# Patient Record
Sex: Female | Born: 1975 | Hispanic: No | Marital: Married | Smoking: Never smoker
Health system: Southern US, Community
[De-identification: ages and names within clinical notes are randomized; demographics above are authoritative.]

## PROBLEM LIST (undated history)

## (undated) DIAGNOSIS — O09529 Supervision of elderly multigravida, unspecified trimester: Secondary | ICD-10-CM

## (undated) DIAGNOSIS — D573 Sickle-cell trait: Secondary | ICD-10-CM

## (undated) DIAGNOSIS — O039 Complete or unspecified spontaneous abortion without complication: Secondary | ICD-10-CM

## (undated) DIAGNOSIS — M5430 Sciatica, unspecified side: Secondary | ICD-10-CM

## (undated) DIAGNOSIS — N390 Urinary tract infection, site not specified: Secondary | ICD-10-CM

## (undated) DIAGNOSIS — N898 Other specified noninflammatory disorders of vagina: Secondary | ICD-10-CM

## (undated) DIAGNOSIS — O210 Mild hyperemesis gravidarum: Secondary | ICD-10-CM

## (undated) DIAGNOSIS — IMO0002 Reserved for concepts with insufficient information to code with codable children: Secondary | ICD-10-CM

## (undated) DIAGNOSIS — O4210 Premature rupture of membranes, onset of labor more than 24 hours following rupture, unspecified weeks of gestation: Secondary | ICD-10-CM

## (undated) HISTORY — DX: Complete or unspecified spontaneous abortion without complication: O03.9

## (undated) HISTORY — DX: Premature rupture of membranes, onset of labor more than 24 hours following rupture, unspecified weeks of gestation: O42.10

## (undated) HISTORY — PX: KIDNEY DONATION: SHX685

## (undated) HISTORY — DX: Mild hyperemesis gravidarum: O21.0

## (undated) HISTORY — DX: Urinary tract infection, site not specified: N39.0

## (undated) HISTORY — DX: Other specified noninflammatory disorders of vagina: N89.8

## (undated) HISTORY — DX: Reserved for concepts with insufficient information to code with codable children: IMO0002

## (undated) HISTORY — PX: NO PAST SURGERIES: SHX2092

---

## 1999-03-22 ENCOUNTER — Other Ambulatory Visit: Admission: RE | Admit: 1999-03-22 | Discharge: 1999-03-22 | Payer: Self-pay | Admitting: Obstetrics

## 1999-06-09 ENCOUNTER — Inpatient Hospital Stay (HOSPITAL_COMMUNITY): Admission: AD | Admit: 1999-06-09 | Discharge: 1999-06-12 | Payer: Self-pay | Admitting: Obstetrics & Gynecology

## 2000-05-14 ENCOUNTER — Ambulatory Visit (HOSPITAL_COMMUNITY): Admission: RE | Admit: 2000-05-14 | Discharge: 2000-05-14 | Payer: Self-pay

## 2000-09-24 ENCOUNTER — Inpatient Hospital Stay (HOSPITAL_COMMUNITY): Admission: AD | Admit: 2000-09-24 | Discharge: 2000-09-26 | Payer: Self-pay | Admitting: *Deleted

## 2005-01-01 ENCOUNTER — Inpatient Hospital Stay (HOSPITAL_COMMUNITY): Admission: AD | Admit: 2005-01-01 | Discharge: 2005-01-01 | Payer: Self-pay | Admitting: *Deleted

## 2005-01-17 ENCOUNTER — Inpatient Hospital Stay (HOSPITAL_COMMUNITY): Admission: AD | Admit: 2005-01-17 | Discharge: 2005-01-17 | Payer: Self-pay | Admitting: Obstetrics & Gynecology

## 2005-01-23 ENCOUNTER — Inpatient Hospital Stay (HOSPITAL_COMMUNITY): Admission: AD | Admit: 2005-01-23 | Discharge: 2005-01-23 | Payer: Self-pay | Admitting: Obstetrics and Gynecology

## 2005-02-05 ENCOUNTER — Inpatient Hospital Stay (HOSPITAL_COMMUNITY): Admission: AD | Admit: 2005-02-05 | Discharge: 2005-02-05 | Payer: Self-pay | Admitting: *Deleted

## 2005-03-27 ENCOUNTER — Ambulatory Visit (HOSPITAL_COMMUNITY): Admission: RE | Admit: 2005-03-27 | Discharge: 2005-03-27 | Payer: Self-pay | Admitting: *Deleted

## 2005-04-04 ENCOUNTER — Ambulatory Visit: Payer: Self-pay | Admitting: *Deleted

## 2005-05-30 ENCOUNTER — Ambulatory Visit (HOSPITAL_COMMUNITY): Admission: RE | Admit: 2005-05-30 | Discharge: 2005-05-30 | Payer: Self-pay | Admitting: *Deleted

## 2005-08-26 ENCOUNTER — Ambulatory Visit: Payer: Self-pay | Admitting: Certified Nurse Midwife

## 2005-08-26 ENCOUNTER — Inpatient Hospital Stay (HOSPITAL_COMMUNITY): Admission: AD | Admit: 2005-08-26 | Discharge: 2005-08-29 | Payer: Self-pay | Admitting: Obstetrics and Gynecology

## 2009-10-07 ENCOUNTER — Ambulatory Visit: Payer: Self-pay | Admitting: Family Medicine

## 2009-10-07 ENCOUNTER — Encounter (INDEPENDENT_AMBULATORY_CARE_PROVIDER_SITE_OTHER): Payer: Self-pay | Admitting: Family Medicine

## 2009-10-07 LAB — CONVERTED CEMR LAB
ALT: 9 units/L (ref 0–35)
AST: 9 units/L (ref 0–37)
Albumin: 4.2 g/dL (ref 3.5–5.2)
Alkaline Phosphatase: 46 units/L (ref 39–117)
BUN: 9 mg/dL (ref 6–23)
Basophils Absolute: 0 10*3/uL (ref 0.0–0.1)
Basophils Relative: 0 % (ref 0–1)
CO2: 24 meq/L (ref 19–32)
Calcium: 9.6 mg/dL (ref 8.4–10.5)
Chloride: 105 meq/L (ref 96–112)
Creatinine, Ser: 0.76 mg/dL (ref 0.40–1.20)
Eosinophils Absolute: 0.2 10*3/uL (ref 0.0–0.7)
Eosinophils Relative: 2 % (ref 0–5)
Glucose, Bld: 131 mg/dL — ABNORMAL HIGH (ref 70–99)
HCT: 33.5 % — ABNORMAL LOW (ref 36.0–46.0)
Helicobacter Pylori Antibody-IgG: 0.8
Hemoglobin: 11 g/dL — ABNORMAL LOW (ref 12.0–15.0)
Lymphocytes Relative: 41 % (ref 12–46)
Lymphs Abs: 3.4 10*3/uL (ref 0.7–4.0)
MCHC: 32.8 g/dL (ref 30.0–36.0)
MCV: 79 fL (ref 78.0–100.0)
Monocytes Absolute: 0.4 10*3/uL (ref 0.1–1.0)
Monocytes Relative: 5 % (ref 3–12)
Neutro Abs: 4.3 10*3/uL (ref 1.7–7.7)
Neutrophils Relative %: 52 % (ref 43–77)
Platelets: 282 10*3/uL (ref 150–400)
Potassium: 3.6 meq/L (ref 3.5–5.3)
RBC: 4.24 M/uL (ref 3.87–5.11)
RDW: 13.4 % (ref 11.5–15.5)
Sodium: 138 meq/L (ref 135–145)
TSH: 1.191 microintl units/mL (ref 0.350–4.500)
Total Bilirubin: 0.2 mg/dL — ABNORMAL LOW (ref 0.3–1.2)
Total Protein: 7 g/dL (ref 6.0–8.3)
Vit D, 25-Hydroxy: 27 ng/mL — ABNORMAL LOW (ref 30–89)
WBC: 8.2 10*3/uL (ref 4.0–10.5)

## 2009-11-18 ENCOUNTER — Ambulatory Visit: Payer: Self-pay | Admitting: Internal Medicine

## 2009-11-19 ENCOUNTER — Encounter (INDEPENDENT_AMBULATORY_CARE_PROVIDER_SITE_OTHER): Payer: Self-pay | Admitting: Family Medicine

## 2010-03-29 ENCOUNTER — Encounter (INDEPENDENT_AMBULATORY_CARE_PROVIDER_SITE_OTHER): Payer: Self-pay | Admitting: *Deleted

## 2010-03-29 LAB — CONVERTED CEMR LAB
Basophils Absolute: 0 10*3/uL (ref 0.0–0.1)
Basophils Relative: 0 % (ref 0–1)
Eosinophils Relative: 3 % (ref 0–5)
HCT: 33.7 % — ABNORMAL LOW (ref 36.0–46.0)
Hemoglobin: 11.2 g/dL — ABNORMAL LOW (ref 12.0–15.0)
MCHC: 33.2 g/dL (ref 30.0–36.0)
Monocytes Absolute: 0.6 10*3/uL (ref 0.1–1.0)
RDW: 13.3 % (ref 11.5–15.5)

## 2010-07-09 ENCOUNTER — Encounter: Payer: Self-pay | Admitting: *Deleted

## 2010-07-27 ENCOUNTER — Emergency Department (HOSPITAL_COMMUNITY): Payer: Self-pay

## 2010-07-27 ENCOUNTER — Encounter (HOSPITAL_COMMUNITY): Payer: Self-pay | Admitting: Radiology

## 2010-07-27 ENCOUNTER — Emergency Department (HOSPITAL_COMMUNITY)
Admission: EM | Admit: 2010-07-27 | Discharge: 2010-07-27 | Disposition: A | Discharge status: Home / Self Care | Payer: Self-pay | Attending: Emergency Medicine | Admitting: Emergency Medicine

## 2010-07-27 DIAGNOSIS — M25519 Pain in unspecified shoulder: Secondary | ICD-10-CM | POA: Insufficient documentation

## 2010-07-27 DIAGNOSIS — R0789 Other chest pain: Secondary | ICD-10-CM | POA: Insufficient documentation

## 2010-07-27 DIAGNOSIS — M79609 Pain in unspecified limb: Secondary | ICD-10-CM | POA: Insufficient documentation

## 2010-07-27 DIAGNOSIS — R110 Nausea: Secondary | ICD-10-CM | POA: Insufficient documentation

## 2010-07-27 DIAGNOSIS — M546 Pain in thoracic spine: Secondary | ICD-10-CM | POA: Insufficient documentation

## 2010-07-27 LAB — BASIC METABOLIC PANEL
BUN: 6 mg/dL (ref 6–23)
GFR calc non Af Amer: >60 mL/min (ref >60)
Glucose, Bld: 141 mg/dL — ABNORMAL HIGH (ref 70–99)
Potassium: 3.8 mEq/L (ref 3.5–5.1)

## 2010-07-27 LAB — URINALYSIS, ROUTINE W REFLEX MICROSCOPIC
Bilirubin Urine: NEGATIVE
Specific Gravity, Urine: 1.011 (ref 1.005–1.030)
pH: 7.5 (ref 5.0–8.0)

## 2010-07-27 LAB — URINE MICROSCOPIC-ADD ON

## 2010-07-27 LAB — CBC
HCT: 35.7 % — ABNORMAL LOW (ref 36.0–46.0)
MCH: 26.7 pg (ref 26.0–34.0)
MCHC: 33.9 g/dL (ref 30.0–36.0)
RDW: 12.9 % (ref 11.5–15.5)

## 2010-07-27 LAB — POCT CARDIAC MARKERS: CKMB, poc: <1 ng/mL — ABNORMAL LOW (ref 1.0–8.0)

## 2010-07-27 LAB — DIFFERENTIAL
Basophils Absolute: 0 10*3/uL (ref 0.0–0.1)
Eosinophils Relative: 0 % (ref 0–5)
Lymphocytes Relative: 25 % (ref 12–46)
Monocytes Absolute: 0.2 10*3/uL (ref 0.1–1.0)
Monocytes Relative: 3 % (ref 3–12)

## 2010-07-27 MED ORDER — IOHEXOL 300 MG/ML  SOLN: 100.0000 mL | Freq: Once | INTRAMUSCULAR | Status: DC | PRN

## 2010-10-10 ENCOUNTER — Other Ambulatory Visit (HOSPITAL_COMMUNITY): Payer: Self-pay | Admitting: Family Medicine

## 2010-10-10 ENCOUNTER — Ambulatory Visit (HOSPITAL_COMMUNITY)
Admission: RE | Admit: 2010-10-10 | Discharge: 2010-10-10 | Disposition: A | Discharge status: Home / Self Care | Payer: Self-pay | Source: Ambulatory Visit | Attending: Family Medicine | Admitting: Family Medicine

## 2010-10-10 DIAGNOSIS — M542 Cervicalgia: Secondary | ICD-10-CM

## 2010-10-10 DIAGNOSIS — M25519 Pain in unspecified shoulder: Secondary | ICD-10-CM | POA: Insufficient documentation

## 2011-04-04 ENCOUNTER — Emergency Department (HOSPITAL_COMMUNITY): Payer: BC Managed Care – PPO

## 2011-04-04 ENCOUNTER — Emergency Department (HOSPITAL_COMMUNITY)
Admission: EM | Admit: 2011-04-04 | Discharge: 2011-04-04 | Disposition: A | Discharge status: Home / Self Care | Payer: BC Managed Care – PPO | Attending: Emergency Medicine | Admitting: Emergency Medicine

## 2011-04-04 DIAGNOSIS — M25519 Pain in unspecified shoulder: Secondary | ICD-10-CM | POA: Insufficient documentation

## 2011-04-04 DIAGNOSIS — M62838 Other muscle spasm: Secondary | ICD-10-CM | POA: Insufficient documentation

## 2011-10-05 ENCOUNTER — Ambulatory Visit (INDEPENDENT_AMBULATORY_CARE_PROVIDER_SITE_OTHER): Payer: BC Managed Care – PPO | Admitting: Physician Assistant

## 2011-10-05 VITALS — BP 113/70 | HR 75 | Temp 98.7°F | Resp 16 | Ht 69.5 in | Wt 205.0 lb

## 2011-10-05 DIAGNOSIS — R5383 Other fatigue: Secondary | ICD-10-CM

## 2011-10-05 DIAGNOSIS — N912 Amenorrhea, unspecified: Secondary | ICD-10-CM

## 2011-10-05 DIAGNOSIS — R110 Nausea: Secondary | ICD-10-CM

## 2011-10-05 LAB — POCT URINE PREGNANCY: Preg Test, Ur: NEGATIVE

## 2011-10-05 LAB — POCT CBC
Hemoglobin: 12.1 g/dL — AB (ref 12.2–16.2)
MCH, POC: 26.5 pg — AB (ref 27–31.2)
MCV: 80.9 fL (ref 80–97)
RBC: 4.57 M/uL (ref 4.04–5.48)
WBC: 8.8 10*3/uL (ref 4.6–10.2)

## 2011-10-05 MED ORDER — PROMETHAZINE HCL 25 MG PO TABS
25.0000 mg | ORAL_TABLET | Freq: Three times a day (TID) | ORAL | Status: DC | PRN
Start: 2011-10-05 — End: 2012-07-16

## 2011-10-05 NOTE — Progress Notes (Signed)
  Subjective:    Patient ID: Kirsten Hoover, female    DOB: [birth year], 36 y.o.   MRN: [id number]  HPI 36 yo female here with complaints of fatigue and nausea.  S/P IUD removal 09/13/11.  Not using other BC.  She is trying to get pregnant and is on PNV.  Symptoms for ~1week.  No abdominal pain, no breast tenderness. No diarrhea or constipation.  When she did have the IUD she continued having periods regularly.  Her last menses started 09/01/11. She took a home pregnancy test today which was negative. She would like to do a test here. Review of Systems  All other systems reviewed and are negative.      Objective:   Physical Exam  Nursing note and vitals reviewed. Constitutional: She is oriented to person, place, and time. She appears well-developed and well-nourished.  HENT:  Head: Normocephalic and atraumatic.  Neck: Normal range of motion. Neck supple. No thyromegaly present.  Cardiovascular: Normal rate, regular rhythm, normal heart sounds and intact distal pulses.   Pulmonary/Chest: Effort normal and breath sounds normal.  Abdominal: Soft. Bowel sounds are normal.  Neurological: She is alert and oriented to person, place, and time.  Skin: Skin is warm and dry.   Results for orders placed in visit on 10/05/11  POCT URINE PREGNANCY      Component Value Range   Preg Test, Ur Negative    POCT CBC      Component Value Range   WBC 8.8  4.6 - 10.2 (K/uL)   Lymph, poc 4.3 (*) 0.6 - 3.4    POC LYMPH PERCENT 48.6  10 - 50 (%L)   MID (cbc) 0.4  0 - 0.9    POC MID % 5.1  0 - 12 (%M)   POC Granulocyte 4.1  2 - 6.9    Granulocyte percent 46.3  37 - 80 (%G)   RBC 4.57  4.04 - 5.48 (M/uL)   Hemoglobin 12.1 (*) 12.2 - 16.2 (g/dL)   HCT, POC 40.9 (*) 81.1 - 47.9 (%)   MCV 80.9  80 - 97 (fL)   MCH, POC 26.5 (*) 27 - 31.2 (pg)   MCHC 32.7  31.8 - 35.4 (g/dL)   RDW, POC 91.4     Platelet Count, POC 342  142 - 424 (K/uL)   MPV 10.3  0 - 99.8 (fL)       Assessment & Plan:  Fatigue-?viral  syndrome, check TSH Amenorrhea-neg pregnancy test. Nausea w/o vomiting-Phenergan sent to pharmacy Advised to take another pregnancy test in 1 week if still no menses.

## 2011-10-25 ENCOUNTER — Ambulatory Visit (INDEPENDENT_AMBULATORY_CARE_PROVIDER_SITE_OTHER): Payer: BC Managed Care – PPO | Admitting: Family Medicine

## 2011-10-25 VITALS — BP 131/85 | HR 64 | Temp 98.4°F | Resp 16 | Ht 70.0 in | Wt 203.0 lb

## 2011-10-25 DIAGNOSIS — R1013 Epigastric pain: Secondary | ICD-10-CM

## 2011-10-25 DIAGNOSIS — R1011 Right upper quadrant pain: Secondary | ICD-10-CM

## 2011-10-25 DIAGNOSIS — R110 Nausea: Secondary | ICD-10-CM

## 2011-10-25 LAB — POCT CBC
HCT, POC: 37.8 % (ref 37.7–47.9)
Lymph, poc: 3.9 — AB (ref 0.6–3.4)
MCHC: 33.6 g/dL (ref 31.8–35.4)
MPV: 9.8 fL (ref 0–99.8)
POC Granulocyte: 3.6 (ref 2–6.9)
POC LYMPH PERCENT: 48.7 %L (ref 10–50)
POC MID %: 5.7 %M (ref 0–12)
RDW, POC: 13.2 %

## 2011-10-25 MED ORDER — OMEPRAZOLE 20 MG PO CPDR: 20.0000 mg | DELAYED_RELEASE_CAPSULE | Freq: Every day | ORAL | Status: DC

## 2011-10-25 MED ORDER — ONDANSETRON 4 MG PO TBDP: 4.0000 mg | ORAL_TABLET | Freq: Three times a day (TID) | ORAL | Status: AC | PRN

## 2011-10-25 NOTE — Progress Notes (Signed)
Subjective:    Patient ID: Kirsten Hoover, female    DOB: 07-Aug-1975, 36 y.o.   MRN: [id number]  HPI Kirsten Hoover is a 36 y.o. female Seen 10/05/11 with fatigue and nausea. "S/P IUD removal 09/13/11.  Not using other BC.  She is trying to get pregnant and is on PNV.  Symptoms for ~1week= at that time.  No abdominal pain, no breast tenderness. No diarrhea or constipation at that time..  When she did have the IUD she continued having periods regularly.  Her last menses started 09/01/11. She took a home pregnancy test 10/05/11 which was negative.  Negative HCG , reassuring CBC and TSH last office visit.  Rx Phenergan.  Today states she has back and forth symptoms of gas and abd pain. No blood in stools.  Nausea with milk or eating at times. Took Phenergan - helped with sx's - last dose 3 das ago.  LMP 10/05/11 - 8 days.  Normal menses.  Upper abdomen episodic pain at times.  Increased belching/gas.  Tylenol occasionally - stress at work.  Designer, [employer].   Nonsmoker, no alcohol.  2 caffeinated drinks per day. Notes some increased gas/stomach upset with caffeine.  Review of Systems  Constitutional: Negative for fever and chills.  Gastrointestinal: Positive for nausea, abdominal pain and diarrhea. Negative for vomiting, constipation and blood in stool.       Occasional diarrhea with milk or cheese.   Genitourinary: Negative for dysuria, urgency, hematuria, difficulty urinating, menstrual problem and pelvic pain.  Neurological: Positive for headaches.       Occasional ha with stress at work.       Objective:   Physical Exam  Constitutional: She is oriented to person, place, and time. She appears well-developed and well-nourished.  Cardiovascular: Normal rate, normal heart sounds and intact distal pulses.   Pulmonary/Chest: Effort normal and breath sounds normal.  Abdominal: Soft. Normal appearance and bowel sounds are normal. She exhibits no shifting dullness and no distension. There is no  hepatosplenomegaly. There is tenderness in the right upper quadrant and epigastric area. There is no CVA tenderness, no tenderness at McBurney's point and negative Murphy's sign. No hernia.       ttp epigastric and RUQ, but negative murphys  Neurological: She is alert and oriented to person, place, and time.  Skin: Skin is warm and dry. No rash noted.  Psychiatric: She has a normal mood and affect. Her behavior is normal.   Results for orders placed in visit on 10/25/11  POCT CBC      Component Value Range   WBC 8.0  4.6 - 10.2 (K/uL)   Lymph, poc 3.9 (*) 0.6 - 3.4    POC LYMPH PERCENT 48.7  10 - 50 (%L)   MID (cbc) 0.5  0 - 0.9    POC MID % 5.7  0 - 12 (%M)   POC Granulocyte 3.6  2 - 6.9    Granulocyte percent 45.6  37 - 80 (%G)   RBC 4.72  4.04 - 5.48 (M/uL)   Hemoglobin 12.7  12.2 - 16.2 (g/dL)   HCT, POC 40.9  81.1 - 47.9 (%)   MCV 80.1  80 - 97 (fL)   MCH, POC 26.9 (*) 27 - 31.2 (pg)   MCHC 33.6  31.8 - 35.4 (g/dL)   RDW, POC 91.4     Platelet Count, POC 338  142 - 424 (K/uL)   MPV 9.8  0 - 99.8 (fL)  Assessment & Plan:  Kirsten Hoover is a 36 y.o. female  abd pain, nausea, increased gas.  Possible multifactorial - lactose intolerance, GERD+/- gastritis, and possible cholelithiasis with RUQ symptoms.  Check CMP, CBC, H pylori and RUQ [place]. Avoid dairy,  And decrease caffeine, fried/fatty/spicy food intake. Start omeprazole 20mg  QD, zofran Q 8h prn, and recheck in 2 weeks, sooner if worse.

## 2011-10-25 NOTE — Patient Instructions (Signed)
Check CMP, CBC, H pylori and RUQ [place]. Avoid dairy, decrease caffeine, fried/fatty/spicy food intake. Start omeprazole 20mg  each day, and zofran up to every 8 hours as needed for nausea. recheck in 2 weeks, sooner if worse.   Lactose Intolerance, Adult Lactose intolerance is when the body is not able to digest lactose, a sugar found in milk and milk products. Lactose intolerance is caused by your body not producing enough of the enzyme lactase. When there is not enough lactase to digest the amount of lactose consumed, discomfort may be felt. Lactose intolerance is not a milk allergy. For most people, lactase deficiency is a condition that develops naturally over time. After about the age of 2, the body begins to produce less lactase. But many people may not experience symptoms until they are much older. CAUSES Things that can cause you to be lactose intolerant include:  Aging.   Being born without the ability to make lactase.   Certain digestive diseases.   Injuries to the small intestine.  SYMPTOMS   Feeling sick to your stomach (nauseous).   Diarrhea.   Cramps.   Bloating.   Gas.  Symptoms usually show up a half hour or 2 hours after eating or drinking products containing lactose. TREATMENT  No treatment can improve the body's ability to produce lactase. However, symptoms can be controlled through diet. A medicine may be given to you to take when you consume lactose-containing foods or drinks. The medicine contains the lactase enzyme, which help the body digest lactose better. HOME CARE INSTRUCTIONS  Eat or drink dairy products as told by your caregiver or dietician.   Take all medicine as directed by your caregiver.   Find lactose-free or lactose-reduced products at your local grocery store.   Talk to your caregiver or dietician to decide if you need any dietary supplements.  The following is the amount of calcium needed from the diet:  19 to 50 years: 1000 mg   Over 50  years: 1200 mg  Calcium and Lactose in Common Foods Non-Dairy Products / Calcium Content (mg)  Calcium-fortified orange juice, 1 cup / 308 to 344 mg   Sardines, with edible bones, 3 oz / 270 mg   Salmon, canned, with edible bones, 3 oz / 205 mg   Soymilk, fortified, 1 cup / 200 mg   Broccoli (raw), 1 cup / 90 mg   Orange, 1 medium / 50 mg   Pinto beans,  cup / 40 mg   Tuna, canned, 3 oz / 10 mg   Lettuce greens,  cup / 10 mg  Dairy Products / Calcium Content (mg) / Lactose Content (g)  Yogurt, plain, low-fat, 1 cup / 415 mg / 5 g   Milk, reduced fat, 1 cup / 295 mg / 11 g   Swiss cheese, 1 oz / 270 mg / 1 g   Ice cream,  cup / 85 mg / 6 g   Cottage cheese,  cup / 75 mg / 2 to 3 g  SEEK MEDICAL CARE IF: You have no relief from your symptoms. Document Released: 06/05/2005 Document Revised: 05/25/2011 Document Reviewed: 09/02/2010 [facility] Patient Information 2012 [place], [place].

## 2011-10-26 LAB — COMPREHENSIVE METABOLIC PANEL
Alkaline Phosphatase: 52 U/L (ref 39–117)
Creat: 0.6 mg/dL (ref 0.50–1.10)
Glucose, Bld: 91 mg/dL (ref 70–99)
Sodium: 136 mEq/L (ref 135–145)
Total Bilirubin: 0.3 mg/dL (ref 0.3–1.2)
Total Protein: 7.6 g/dL (ref 6.0–8.3)

## 2011-11-03 ENCOUNTER — Ambulatory Visit
Admission: RE | Admit: 2011-11-03 | Discharge: 2011-11-03 | Disposition: A | Discharge status: Home / Self Care | Payer: BC Managed Care – PPO | Source: Ambulatory Visit | Attending: Family Medicine | Admitting: Family Medicine

## 2011-11-03 ENCOUNTER — Other Ambulatory Visit: Payer: Self-pay

## 2011-11-03 DIAGNOSIS — R1013 Epigastric pain: Secondary | ICD-10-CM

## 2011-11-03 DIAGNOSIS — R1011 Right upper quadrant pain: Secondary | ICD-10-CM

## 2011-11-03 DIAGNOSIS — R110 Nausea: Secondary | ICD-10-CM

## 2012-02-20 ENCOUNTER — Ambulatory Visit (INDEPENDENT_AMBULATORY_CARE_PROVIDER_SITE_OTHER): Payer: BC Managed Care – PPO | Admitting: Family Medicine

## 2012-02-20 VITALS — BP 112/68 | HR 83 | Temp 98.4°F | Resp 16 | Ht 70.0 in | Wt 203.0 lb

## 2012-02-20 DIAGNOSIS — R109 Unspecified abdominal pain: Secondary | ICD-10-CM

## 2012-02-20 DIAGNOSIS — R1084 Generalized abdominal pain: Secondary | ICD-10-CM

## 2012-02-20 DIAGNOSIS — Z3200 Encounter for pregnancy test, result unknown: Secondary | ICD-10-CM

## 2012-02-20 DIAGNOSIS — Z3201 Encounter for pregnancy test, result positive: Secondary | ICD-10-CM

## 2012-02-20 LAB — POCT URINE PREGNANCY: Preg Test, Ur: POSITIVE

## 2012-02-20 NOTE — Patient Instructions (Addendum)
You are indeed pregnant. Make an appointment to be seen by your OB/GYN.  If you have any vaginal bleeding, any abdominal pain, any difficulty passing urine come back to see [place] immediately or go to [facility].   We will call you with the ultrasound appointment.

## 2012-02-20 NOTE — Progress Notes (Signed)
Patient ID: Kirsten Hoover, female   DOB: 1975/07/11, 36 y.o.   MRN: [id number] Kirsten Hoover is a 36 y.o. female who presents to Urgent Care today for confirmation of positive pregnancy test which she took at home:  1.  Positive pregnancy test:  Patient had positive test 2 days ago.  Her LMP was July 25.  She has not had any nausea, vomiting, change in urination, change in bowel habits.  This was an unplanned pregnancy.  She is a [id number].  She is in monogamous relationship with husband.  She does report some brownish vaginal discharge that appeared 3 days ago and recurred today.  She denies any abdominal pain, fevers, or chills.     PMH reviewed.  ROS as above otherwise neg.  No chest pain, palpitations, SOB, N/V/D.  Medications reviewed. Current Outpatient Prescriptions  Medication Sig Dispense Refill  . promethazine (PHENERGAN) 25 MG tablet Take 1 tablet (25 mg total) by mouth every 8 (eight) hours as needed for nausea.  20 tablet  0    Exam:  BP 112/68  Pulse 83  Temp 98.4 F (36.9 C)  Resp 16  Ht 5\' 10"  (1.778 m)  Wt 203 lb (92.08 kg)  BMI 29.13 kg/m2  LMP 01/13/2012 Gen: Well NAD HEENT: EOMI,  MMM Lungs: CTABL Nl WOB Heart: RRR no MRG Abd: Normal bowel sounds, bilateral lower quadrant tenderness on palpation, mild in nature.  No guarding or rebound.  No Psoas sign negative.   Exts: Non edematous BL  LE, warm and well perfused.   Assessment and Plan:  1.  Positive pregnancy test:  Confirmed here in clinic.  Patient plans to set up an appointment with her OB-GYN.  This was unplanned pregnancy, but not unwanted.   2.  Vaginal discharge and abdominal tenderness:  After finding the tenderness, asked again if she had any abdominal pain before I pressed on her and she said no.  Patient denies that her discharge looks like old or fresh blood.  Want to ensure no ectopic pregnancy, will send for STAT ultrasound of pelvis.  If intra-uterine pregnancy confirmed, possible physiologic  discharge.  I provided the patient with explicit warnings and red flags that would prompt return to clinic or the ED, including any sudden abdominal pain, vaginal bleeding, etc.  Will call patient with results.

## 2012-02-21 ENCOUNTER — Other Ambulatory Visit: Payer: Self-pay | Admitting: Family Medicine

## 2012-02-21 ENCOUNTER — Ambulatory Visit (HOSPITAL_COMMUNITY)
Admission: RE | Admit: 2012-02-21 | Discharge: 2012-02-21 | Disposition: A | Discharge status: Home / Self Care | Payer: BC Managed Care – PPO | Source: Ambulatory Visit | Attending: Family Medicine | Admitting: Family Medicine

## 2012-02-21 DIAGNOSIS — Z3689 Encounter for other specified antenatal screening: Secondary | ICD-10-CM | POA: Insufficient documentation

## 2012-02-21 DIAGNOSIS — R109 Unspecified abdominal pain: Secondary | ICD-10-CM

## 2012-02-21 DIAGNOSIS — Z3200 Encounter for pregnancy test, result unknown: Secondary | ICD-10-CM

## 2012-02-21 DIAGNOSIS — Z3490 Encounter for supervision of normal pregnancy, unspecified, unspecified trimester: Secondary | ICD-10-CM

## 2012-02-21 DIAGNOSIS — O3680X0 Pregnancy with inconclusive fetal viability, not applicable or unspecified: Secondary | ICD-10-CM | POA: Insufficient documentation

## 2012-02-21 DIAGNOSIS — R1084 Generalized abdominal pain: Secondary | ICD-10-CM

## 2012-02-22 ENCOUNTER — Telehealth: Payer: Self-pay | Admitting: Family Medicine

## 2012-02-22 NOTE — Telephone Encounter (Signed)
Patient returned call.  Discussed intrauterine pregnancy.  Patient appreciative of call.  Will defer decision of FU U/S to obstetrician.

## 2012-02-22 NOTE — Telephone Encounter (Signed)
Called and left message for patient.  She had viable intrauterine pregnancy at 4 weeks 4 days.  Recommended FU ultrasound in 2 weeks to assess for cardiac activity.  Defer to [facility] for further management.

## 2012-03-07 ENCOUNTER — Inpatient Hospital Stay (HOSPITAL_COMMUNITY)
Admission: AD | Admit: 2012-03-07 | Discharge: 2012-03-07 | Disposition: A | Discharge status: Home / Self Care | Payer: BC Managed Care – PPO | Source: Ambulatory Visit | Attending: Obstetrics and Gynecology | Admitting: Obstetrics and Gynecology

## 2012-03-07 ENCOUNTER — Inpatient Hospital Stay (HOSPITAL_COMMUNITY): Payer: BC Managed Care – PPO

## 2012-03-07 ENCOUNTER — Encounter (HOSPITAL_COMMUNITY): Payer: Self-pay

## 2012-03-07 DIAGNOSIS — O26859 Spotting complicating pregnancy, unspecified trimester: Secondary | ICD-10-CM | POA: Insufficient documentation

## 2012-03-07 DIAGNOSIS — O468X9 Other antepartum hemorrhage, unspecified trimester: Secondary | ICD-10-CM

## 2012-03-07 DIAGNOSIS — O418X90 Other specified disorders of amniotic fluid and membranes, unspecified trimester, not applicable or unspecified: Secondary | ICD-10-CM

## 2012-03-07 DIAGNOSIS — O4690 Antepartum hemorrhage, unspecified, unspecified trimester: Secondary | ICD-10-CM

## 2012-03-07 LAB — CBC WITH DIFFERENTIAL/PLATELET
Basophils Relative: 0 % (ref 0–1)
HCT: 35 % — ABNORMAL LOW (ref 36.0–46.0)
Hemoglobin: 11.7 g/dL — ABNORMAL LOW (ref 12.0–15.0)
Lymphocytes Relative: 50 % — ABNORMAL HIGH (ref 12–46)
Lymphs Abs: 4.3 10*3/uL — ABNORMAL HIGH (ref 0.7–4.0)
MCHC: 33.4 g/dL (ref 30.0–36.0)
Monocytes Absolute: 0.6 10*3/uL (ref 0.1–1.0)
Monocytes Relative: 8 % (ref 3–12)
Neutro Abs: 3.4 10*3/uL (ref 1.7–7.7)
RBC: 4.33 MIL/uL (ref 3.87–5.11)

## 2012-03-07 LAB — ABO/RH: ABO/RH(D): O POS

## 2012-03-07 NOTE — MAU Provider Note (Signed)
History     CSN: [id number]  Arrival date and time: 03/07/12 1629   None     No chief complaint on file.  HPI Kirsten Hoover is a 36 y.o. female @ [redacted] weeks gestation who presents to MAU for follow up ultrasound. She reports brown vaginal spotting. She had a positive pregnancy test and ultrasound 02/20/12 ordered by her doctor in the [facility]. On 9/3 ultrasound showed a a 4 week 4 day IUGS and it was recommended that she follow up in 2 weeks to repeat the u/s. She has an appointment with CC/OB to start prenatal care but started bleeding today so she came in. The history was provided by the patient and her medical record.  OB History    Grav Para Term Preterm Abortions TAB SAB Ect Mult Living                  No past medical history on file.  No past surgical history on file.  No family history on file.  History  Substance Use Topics  . Smoking status: Never Smoker   . Smokeless tobacco: Not on file  . Alcohol Use: No    Allergies: No Known Allergies  Prescriptions prior to admission  Medication Sig Dispense Refill  . promethazine (PHENERGAN) 25 MG tablet Take 1 tablet (25 mg total) by mouth every 8 (eight) hours as needed for nausea.  20 tablet  0    Review of Systems  Constitutional: Negative for fever and chills.  Eyes: Negative.   Respiratory: Negative for cough and wheezing.   Cardiovascular: Negative.   Gastrointestinal: Positive for nausea and abdominal pain.  Genitourinary: Positive for frequency.       Vaginal bleeding  Musculoskeletal: Positive for back pain.  Skin: Negative for rash.  Neurological: Negative for dizziness, seizures and headaches.  Psychiatric/Behavioral: Negative for depression. The patient is not nervous/anxious.    Physical Exam   Last menstrual period 01/13/2012.  Physical Exam  Nursing note and vitals reviewed. Constitutional: She is oriented to person, place, and time. She appears well-developed and  well-nourished. No distress.  HENT:  Head: Normocephalic and atraumatic.  Eyes: EOM are normal.  Neck: Neck supple.  Cardiovascular: Normal rate.   Respiratory: Effort normal.  Genitourinary:       Patient declined.  Musculoskeletal: Normal range of motion.  Neurological: She is alert and oriented to person, place, and time.  Skin: Skin is warm and dry.  Psychiatric: She has a normal mood and affect. Her behavior is normal. Judgment and thought content normal.   US Ob Transvaginal  03/07/2012  *RADIOLOGY REPORT*  Clinical Data: Pregnant, bleeding  TRANSVAGINAL OBSTETRIC US  Technique:  Transvaginal ultrasound was performed for complete evaluation of the gestation as well as the maternal uterus, adnexal regions, and pelvic cul-de-sac.  Comparison:  02/21/2012.  Intrauterine gestational sac: Present Yolk sac: Present Embryo: Present Cardiac Activity: Absent  CRL: 5 mm           6   w  3   d          [place] EDC: 10/28/2012  Subchorionic hemorrhage: Large  Maternal uterus/adnexae: Left ovary is unremarkable, measuring 2.0 x 3.1 x 2.5 cm, and notable for a probable corpus luteal cyst.  Right ovary is unremarkable, measuring 1.5 x 2.6 x 1.7 cm.  No free fluid.  IMPRESSION: Single intrauterine gestation with estimated gestational age [redacted] weeks 3 days.  Crown-rump measures 5 mm, without  definite cardiac activity.  This is suspicious (but not definitive) for failed pregnancy.  Correlate with serial beta HCG and consider follow-up imaging in 10 days.  Large subchorionic hemorrhage.   Original Report Authenticated By: Charline Bills, M.D.    MAU Course  Procedures Assessment: 36 y.o. female with bleeding in early pregnancy   [facility]   Plan:  Keep scheduled appointment with CC/OB   Return if increased bleeding or pain   NEESE,HOPE, RN, FNP, [facility] 03/07/2012, 4:46 PM

## 2012-03-07 NOTE — MAU Note (Signed)
Pt states was seen here 1 month ago, told everything was ok, this am noted bleeding, has lightened up since then, now light pink, noted with wiping only. Denies pain at present. Did note back pain yesterday, none present today. Denies vaginal d/c changes prior to bleeding

## 2012-03-08 NOTE — MAU Provider Note (Signed)
Attestation of Attending Supervision of Advanced Practitioner (CNM/NP): Evaluation and management procedures were performed by the Advanced Practitioner under my supervision and collaboration.  I have reviewed the Advanced Practitioner's note and chart, and I agree with the management and plan.  Saman Giddens 03/08/2012 6:10 AM

## 2012-03-14 ENCOUNTER — Other Ambulatory Visit: Payer: Self-pay | Admitting: Obstetrics and Gynecology

## 2012-03-14 ENCOUNTER — Ambulatory Visit (INDEPENDENT_AMBULATORY_CARE_PROVIDER_SITE_OTHER): Payer: BC Managed Care – PPO

## 2012-03-14 ENCOUNTER — Encounter: Payer: Self-pay | Admitting: Obstetrics and Gynecology

## 2012-03-14 ENCOUNTER — Telehealth: Payer: Self-pay | Admitting: Obstetrics and Gynecology

## 2012-03-14 ENCOUNTER — Ambulatory Visit (INDEPENDENT_AMBULATORY_CARE_PROVIDER_SITE_OTHER): Payer: BC Managed Care – PPO | Admitting: Obstetrics and Gynecology

## 2012-03-14 VITALS — BP 98/68 | Resp 16 | Wt 204.0 lb

## 2012-03-14 DIAGNOSIS — O26849 Uterine size-date discrepancy, unspecified trimester: Secondary | ICD-10-CM

## 2012-03-14 DIAGNOSIS — O209 Hemorrhage in early pregnancy, unspecified: Secondary | ICD-10-CM

## 2012-03-14 LAB — US OB TRANSVAGINAL

## 2012-03-14 NOTE — Patient Instructions (Signed)
Threatened Miscarriage  A threatened miscarriage is a pregnancy that may end. It may be marked by bleeding during the first 20 weeks of pregnancy. Often, the pregnancy can continue without any more problems. You may be asked to stop:  Having sex (intercourse).   Having orgasms.   Using tampons.   Exercising.   Doing heavy physical activity and work.  HOME CARE   Your doctor may tell you to take bed rest and to stop activities and work.   Write down the number of pads you use each day. Write down how often you change pads. Write down how soaked they are.   Follow your doctor's advice for follow-up visits and tests.   If your blood type is Rh-negative and the father's blood is Rh-positive (or is not known), you may get a shot to protect the baby.   If you have a miscarriage, save all the tissue you pass in a container. Take the container to your doctor.  GET HELP RIGHT AWAY IF:   You have bad cramps or pain in your belly (abdomen), lower belly, or back.   You have a fever or chills.   Your bleeding gets worse or you pass large clots of blood or tissue. Save this tissue to show your doctor.   You feel lightheaded, weak, dizzy, or pass out (faint).   You have a gush of fluid from your vagina.  MAKE SURE YOU:   Understand these instructions.   Will watch your condition.   Will get help right away if you are not doing well or get worse.  Document Released: 05/18/2008 Document Revised: 05/25/2011 Document Reviewed: 06/21/2009 [facility] Patient Information 2012 [place], [place].

## 2012-03-14 NOTE — Telephone Encounter (Signed)
Lab result for Ms. Zephaniah Schetter  Beta HCG : [id number] - resulted 03/14/12  Report taken by D.Connye Burkitt, CNM.

## 2012-03-14 NOTE — Progress Notes (Signed)
When did bleeding start: 2 wks ago How  Long: currently How often changing pad/tampon: none. Mainly happens after wiping  Bleeding Disorders: no Cramping: yes "sometimes" Contraception: no pregnant Fibroids: no Hormone Therapy: no New Medications: no Menopausal Symptoms: no Vag. Discharge: no Abdominal Pain: no Increased Stress: no  *Viability u/s today 2.5cm echogenic structure present could be fetal pole.  GS size today [redacted]w[redacted]d.

## 2012-03-14 NOTE — Telephone Encounter (Signed)
/  Pt called, has appt scheduled for NOB interview on 03/21/12, has spotting.  Pt says had 2 days of brown spotting and today is red.  Was seen @ [facility] for bleeding, US shows pt has large [facility] on 03/07/12, comments stated pt need a f/u US in 10 days d/t no definitive cardiac activity.  Pt sched appt for today w/ MK for eval, pt also sched for [place] as well pt to arrive @ 1400.

## 2012-03-15 ENCOUNTER — Telehealth: Payer: Self-pay

## 2012-03-15 LAB — GC/CHLAMYDIA PROBE AMP, GENITAL
Chlamydia, DNA Probe: NEGATIVE
GC Probe Amp, Genital: NEGATIVE

## 2012-03-15 NOTE — Telephone Encounter (Signed)
LM for pt to cb regarding quant needed on Sat

## 2012-03-15 NOTE — Progress Notes (Signed)
Presents with c/o of "bleeding red to brown x 2 weeks some cramping. Certain of LMP no on contraception at time of conception, taking PNV"  HX of SVD x 3 With AMA  Ojective Calm no distress Lungs clear bilaterally, AP RRR, bowel sounds active abdomen nontender, Normal hair distrubition mons pubis,  EGBUS WNL, sterile speculum exam,  vagina pink, moist normal rugae,  cerix LTC, no cervical motion tenderness, Uterus small No adnexal masses or tenderness Small amount brown to red mucus at cervix GC/CHL sent [place] Vag  6/3 [place] at MAU Large subchorionic hemorrhage, with crown        rump 5 mm [place] today GS 6 3/7 week 2.3 echogenic structure present                 could be fetal pole. WET PREP neg clue, neg trich, neg hyphae A/P early pg bleeding, possible miscarriage discussed s/s bleeding and pain to report reviewed and handout given, QHCG today with f/o at MAU on Saturday 5 pm discussed. Collaboration with Dr. Stefano Gaul per telephone Lavera Guise, CNM Late entry from 12/13/3011

## 2012-03-16 ENCOUNTER — Other Ambulatory Visit: Payer: Self-pay | Admitting: Obstetrics and Gynecology

## 2012-03-16 ENCOUNTER — Telehealth: Payer: Self-pay | Admitting: Obstetrics and Gynecology

## 2012-03-16 NOTE — Telephone Encounter (Signed)
TC to patient--reviewed results of repeat QHCG. 9/26--10,733 9/28--6762  Seen 9/26 for small amount bleeding. Rh + type. Reports had 2 episodes in last 24 hours of moderate bleeding--last one this am.  No bleeding or pain since then.  Findings reviewed. Support offered. Will repeat QHCG on Thursday, 10/3, at [facility]. Office sent message to contact patient to schedule that for this week. Patient to call with any heavy bleeding or severe pain.

## 2012-03-18 ENCOUNTER — Telehealth: Payer: Self-pay | Admitting: Obstetrics and Gynecology

## 2012-03-18 NOTE — Telephone Encounter (Signed)
TC to pt. Per VL, scheduled [facility] 03/21/12. Pt states is doing well. Sympathy given.

## 2012-03-18 NOTE — Telephone Encounter (Signed)
Message copied by Mason Jim on Mon Mar 18, 2012 11:26 AM ------      Message from: Cornelius Moras      Created: Sat Mar 16, 2012  2:43 PM      Regarding: Patient update (labs and appointments)       Patient dx with SAB over this weekend.  I spoke with patient and reviewed findings, and plan for repeat [facility] on Thursday, 10/3.            Please cancel appts on 10/3 (NOB interview) and 10/10 (NOB W/U).            Please call patient to schedule outpatient lab at office on Thursday, 03/21/12 for repeat QHCG.

## 2012-03-21 ENCOUNTER — Other Ambulatory Visit: Payer: BC Managed Care – PPO

## 2012-03-21 DIAGNOSIS — O035 Genital tract and pelvic infection following complete or unspecified spontaneous abortion: Secondary | ICD-10-CM

## 2012-03-22 ENCOUNTER — Telehealth: Payer: Self-pay | Admitting: Obstetrics and Gynecology

## 2012-03-22 DIAGNOSIS — O039 Complete or unspecified spontaneous abortion without complication: Secondary | ICD-10-CM

## 2012-03-22 LAB — HCG, QUANTITATIVE, PREGNANCY: hCG, Beta Chain, Quant, S: 1326.7 m[IU]/mL

## 2012-03-22 NOTE — Telephone Encounter (Signed)
Called pt to sch weekly quant. No answer

## 2012-03-26 NOTE — Telephone Encounter (Signed)
Pt to have weekly quants per VL.  Pt sch for 03-28-12.  ld

## 2012-03-28 ENCOUNTER — Encounter: Payer: Self-pay | Admitting: Obstetrics and Gynecology

## 2012-03-28 ENCOUNTER — Other Ambulatory Visit: Payer: BC Managed Care – PPO

## 2012-03-28 DIAGNOSIS — O030 Genital tract and pelvic infection following incomplete spontaneous abortion: Secondary | ICD-10-CM

## 2012-03-29 ENCOUNTER — Encounter: Payer: Self-pay | Admitting: Obstetrics and Gynecology

## 2012-03-29 ENCOUNTER — Ambulatory Visit (INDEPENDENT_AMBULATORY_CARE_PROVIDER_SITE_OTHER): Payer: BC Managed Care – PPO | Admitting: Obstetrics and Gynecology

## 2012-03-29 VITALS — BP 102/66 | Temp 98.8°F | Wt 205.5 lb

## 2012-03-29 DIAGNOSIS — O039 Complete or unspecified spontaneous abortion without complication: Secondary | ICD-10-CM

## 2012-03-29 HISTORY — DX: Complete or unspecified spontaneous abortion without complication: O03.9

## 2012-03-29 LAB — HCG, QUANTITATIVE, PREGNANCY: hCG, Beta Chain, Quant, S: 151.3 m[IU]/mL

## 2012-03-29 NOTE — Progress Notes (Signed)
Here for f/u of SAB, with weekly QHCGs since NOB on 03/14/12, with no FP noted and large [facility]. QHCGs: 9/26  10733 03/16/12   6762 10/3     1326 10/10         151  Mild back pain last 2-3 days--better today. No bleeding in last 2 days--only moderate in first several days after 10/26. No fever, N/V, or any other symptoms.  Recommend f/u [facility] 10/17, and follow till resolved. Ibuprophen 600 mg po q 6 hours x 2 days, then prn. Call with any increase in symptoms or any concerns. Support to patient for loss. Recommended waiting approx 2 months before trying to conceive again.

## 2012-03-29 NOTE — Progress Notes (Signed)
C/o low back pain x2 days Recent SAB

## 2012-04-04 ENCOUNTER — Other Ambulatory Visit: Payer: BC Managed Care – PPO

## 2012-04-04 DIAGNOSIS — O039 Complete or unspecified spontaneous abortion without complication: Secondary | ICD-10-CM

## 2012-04-11 ENCOUNTER — Other Ambulatory Visit: Payer: BC Managed Care – PPO

## 2012-04-11 DIAGNOSIS — O030 Genital tract and pelvic infection following incomplete spontaneous abortion: Secondary | ICD-10-CM

## 2012-04-12 ENCOUNTER — Telehealth: Payer: Self-pay

## 2012-04-12 NOTE — Telephone Encounter (Signed)
Lm on vm tcb rgd labs 

## 2012-04-12 NOTE — Telephone Encounter (Signed)
Message copied by Rolla Plate on Fri Apr 12, 2012  1:21 PM ------      Message from: Cornelius Moras      Created: Fri Apr 12, 2012 12:44 PM       Repeat in 1 week--that might be the last one...      VL

## 2012-04-15 ENCOUNTER — Telehealth: Payer: Self-pay | Admitting: Obstetrics and Gynecology

## 2012-04-15 NOTE — Telephone Encounter (Signed)
Pt called back @ 1:55pm today regarding test results for HCG. LM on VM for pt to call the office back.  [facility] CMA

## 2012-04-18 ENCOUNTER — Telehealth: Payer: Self-pay

## 2012-04-18 ENCOUNTER — Other Ambulatory Visit: Payer: Self-pay

## 2012-04-18 DIAGNOSIS — O039 Complete or unspecified spontaneous abortion without complication: Secondary | ICD-10-CM

## 2012-04-18 NOTE — Telephone Encounter (Signed)
Tc to pt regarding hcg results. Pt advised that level was at 13.5 and that test needs to be repeated per VL. Pt agreeable and has lab appt scheduled 04/24/12 per pt request.

## 2012-04-24 ENCOUNTER — Other Ambulatory Visit: Payer: BC Managed Care – PPO

## 2012-05-02 ENCOUNTER — Other Ambulatory Visit: Payer: BC Managed Care – PPO

## 2012-05-02 DIAGNOSIS — O039 Complete or unspecified spontaneous abortion without complication: Secondary | ICD-10-CM

## 2012-05-03 ENCOUNTER — Telehealth: Payer: Self-pay

## 2012-05-03 NOTE — Telephone Encounter (Signed)
Lm for pt to c/b. Weekly quants are no longer needed per VL.

## 2012-05-03 NOTE — Telephone Encounter (Signed)
Spoke with pt regarding quant results. Advised pt that per VL quant is essentially negative and weekly quants no longer needed. Pt voiced understanding.

## 2012-05-03 NOTE — Telephone Encounter (Signed)
LM for pt to cb 

## 2012-05-04 ENCOUNTER — Ambulatory Visit (INDEPENDENT_AMBULATORY_CARE_PROVIDER_SITE_OTHER): Payer: BC Managed Care – PPO | Admitting: Internal Medicine

## 2012-05-04 VITALS — BP 132/79 | HR 80 | Temp 98.1°F | Resp 16 | Ht 70.0 in | Wt 200.6 lb

## 2012-05-04 DIAGNOSIS — N63 Unspecified lump in unspecified breast: Secondary | ICD-10-CM

## 2012-05-04 NOTE — Progress Notes (Signed)
  Subjective:    Patient ID: Kirsten Hoover, female    DOB: 08-11-1975, 36 y.o.   MRN: [id number]  HPI 36 year old female presents with 1 week history of lump under right arm.  States she noticed it about 1 week ago. Denies pain, drainage, warmth, tenderness, or any changes. It has not gotten bigger or smaller.  Is here today because she is concerned about the possibility of cancer.  No family history of breast cancer.  She has never had a mammogram or breast ultrasound.  Denies fever, chills, unexplained weight loss, or fatigue.     Review of Systems  Constitutional: Negative for fever, chills, fatigue and unexpected weight change.  Skin: Negative for rash.  Hematological: Does not bruise/bleed easily.  All other systems reviewed and are negative.       Objective:   Physical Exam  Constitutional: She is oriented to person, place, and time. She appears well-developed and well-nourished.  HENT:  Head: Normocephalic and atraumatic.  Right Ear: External ear normal.  Left Ear: External ear normal.  Eyes: Conjunctivae normal are normal.  Neck: Normal range of motion.  Cardiovascular: Normal rate.   Pulmonary/Chest: Effort normal.  Neurological: She is alert and oriented to person, place, and time.  Skin:     Psychiatric: She has a normal mood and affect. Her behavior is normal. Judgment and thought content normal.        Assessment & Plan:   1. Lump of breast  US Breast Right   Will schedule right breast ultrasound next week for further evaluation Likely a small lipoma that will be benign and require no further workup pending negative ultrasound.   Addend: lump 1.5cm freely movable nontender R axillae at edge of breast tissue I have reviewed and agree with documentation. Robert P. Merla Riches, M.D.

## 2012-05-06 ENCOUNTER — Other Ambulatory Visit: Payer: Self-pay

## 2012-05-06 ENCOUNTER — Telehealth: Payer: Self-pay | Admitting: Radiology

## 2012-05-06 DIAGNOSIS — N63 Unspecified lump in unspecified breast: Secondary | ICD-10-CM

## 2012-05-06 NOTE — Telephone Encounter (Signed)
Please advise, can patient have mammogram? Looks like she had recent pregnancy, but may no longer be pregnant, please advise. Imaging center would like to do one if she can have one, pended order.

## 2012-05-06 NOTE — Telephone Encounter (Signed)
Ok to have order for [employer]

## 2012-05-06 NOTE — Telephone Encounter (Signed)
Order signed, Lorain Childes

## 2012-05-06 NOTE — Telephone Encounter (Signed)
Message copied by Caffie Damme on Mon May 06, 2012 12:39 PM ------      Message from: Kenard Gower      Created: Mon May 06, 2012 10:31 AM      Regarding: EPIC ORDER       We received an order on this patient to perform and u/s for a right breast lump.  We will also need an order for a diagnostic bilateral mammo unless the patient is pregnant or breast feeding.  Please add this order and we can call patient and schedule.                  Thanks       Scheduling

## 2012-05-08 ENCOUNTER — Ambulatory Visit
Admission: RE | Admit: 2012-05-08 | Discharge: 2012-05-08 | Disposition: A | Discharge status: Home / Self Care | Payer: BC Managed Care – PPO | Source: Ambulatory Visit | Attending: Physician Assistant | Admitting: Physician Assistant

## 2012-05-08 DIAGNOSIS — N63 Unspecified lump in unspecified breast: Secondary | ICD-10-CM

## 2012-06-19 NOTE — L&D Delivery Note (Signed)
Delivery Note At 11:01 PM a viable female was delivered via Vaginal, Spontaneous Delivery (Presentation: Right Occiput Anterior).  Loose nuchal cord reduced before delivery, shoulders delivered easily, APGAR: 9, 9; weight: pending  Placenta status: Intact, Spontaneous.  Cord: 3 vessels with the following complications: None.  Cord pH: n/a   Anesthesia: Epidural  Episiotomy: None Lacerations: 2nd degree;Periurethral Suture Repair: 3.0 vicryl rapide 4-0 monocryl  Est. Blood Loss (mL): 300  Mom to postpartum.  Baby to nursery-stable. Infant remains skin-skin Pt plans to bf Mom and baby stable in recovery Routine PP orders   Christell Steinmiller M 02/21/2013, 11:59 PM

## 2012-06-27 ENCOUNTER — Ambulatory Visit (INDEPENDENT_AMBULATORY_CARE_PROVIDER_SITE_OTHER): Payer: BC Managed Care – PPO | Admitting: Obstetrics and Gynecology

## 2012-06-27 DIAGNOSIS — O26849 Uterine size-date discrepancy, unspecified trimester: Secondary | ICD-10-CM

## 2012-06-27 DIAGNOSIS — Z3200 Encounter for pregnancy test, result unknown: Secondary | ICD-10-CM

## 2012-06-27 DIAGNOSIS — Z331 Pregnant state, incidental: Secondary | ICD-10-CM

## 2012-06-27 MED ORDER — ONDANSETRON HCL 8 MG PO TABS: 8.0000 mg | ORAL_TABLET | Freq: Three times a day (TID) | ORAL | Status: DC | PRN

## 2012-06-28 ENCOUNTER — Telehealth: Payer: Self-pay | Admitting: Obstetrics and Gynecology

## 2012-06-28 LAB — PRENATAL PANEL VII
Antibody Screen: NEGATIVE
Basophils Absolute: 0 10*3/uL (ref 0.0–0.1)
Eosinophils Relative: 2 % (ref 0–5)
HCT: 34.6 % — ABNORMAL LOW (ref 36.0–46.0)
Lymphocytes Relative: 48 % — ABNORMAL HIGH (ref 12–46)
MCV: 78.8 fL (ref 78.0–100.0)
Monocytes Absolute: 0.6 10*3/uL (ref 0.1–1.0)
Monocytes Relative: 8 % (ref 3–12)
RDW: 13.2 % (ref 11.5–15.5)
Rh Type: POSITIVE
Rubella: 12.8 Index — ABNORMAL HIGH (ref <0.90)
WBC: 8.1 10*3/uL (ref 4.0–10.5)

## 2012-06-28 LAB — VARICELLA ZOSTER ANTIBODY, IGM: Varicella Zoster Ab IgM: 5.45 {ISR} — ABNORMAL HIGH (ref <0.91)

## 2012-06-28 NOTE — Telephone Encounter (Signed)
Tc from pt. Pt was given PNV's and wants to know how to take them bc the package has 2 different types of pills in it. Informed pt to take 1 of each pill(2 pills total) daily. Pt voices understanding.

## 2012-06-28 NOTE — Telephone Encounter (Signed)
Lm on vm to cb per telephone call.  

## 2012-06-29 LAB — CULTURE, OB URINE: Organism ID, Bacteria: NO GROWTH

## 2012-07-01 ENCOUNTER — Telehealth: Payer: Self-pay | Admitting: Obstetrics and Gynecology

## 2012-07-01 ENCOUNTER — Encounter: Payer: Self-pay | Admitting: Obstetrics and Gynecology

## 2012-07-01 DIAGNOSIS — D573 Sickle-cell trait: Secondary | ICD-10-CM | POA: Insufficient documentation

## 2012-07-01 LAB — HEMOGLOBINOPATHY EVALUATION: Hgb F Quant: 0 % (ref 0.0–2.0)

## 2012-07-01 MED ORDER — PROMETHAZINE HCL 25 MG PO TABS: 25.0000 mg | ORAL_TABLET | ORAL | Status: DC | PRN

## 2012-07-01 NOTE — Telephone Encounter (Signed)
Advised pt that Phenergan 25 mg q4hr prn would be sent to pharmacy per [facility]  Darien Ramus, CMA

## 2012-07-02 LAB — POCT URINALYSIS DIPSTICK
Bilirubin, UA: NEGATIVE
Glucose, UA: NEGATIVE
Leukocytes, UA: NEGATIVE
Nitrite, UA: NEGATIVE

## 2012-07-05 ENCOUNTER — Telehealth: Payer: Self-pay | Admitting: Obstetrics and Gynecology

## 2012-07-05 NOTE — Telephone Encounter (Signed)
Pt called back regarding triage message for Nausea. Pt not ava  Left Message for pt to call back.  [facility] CMA

## 2012-07-15 ENCOUNTER — Inpatient Hospital Stay (HOSPITAL_COMMUNITY)
Admission: AD | Admit: 2012-07-15 | Discharge: 2012-07-16 | Disposition: A | Discharge status: Hospice | Payer: BC Managed Care – PPO | Source: Ambulatory Visit | Attending: Obstetrics and Gynecology | Admitting: Obstetrics and Gynecology

## 2012-07-15 DIAGNOSIS — R2230 Localized swelling, mass and lump, unspecified upper limb: Secondary | ICD-10-CM | POA: Diagnosis present

## 2012-07-15 DIAGNOSIS — D573 Sickle-cell trait: Secondary | ICD-10-CM | POA: Insufficient documentation

## 2012-07-15 DIAGNOSIS — O99019 Anemia complicating pregnancy, unspecified trimester: Secondary | ICD-10-CM | POA: Insufficient documentation

## 2012-07-15 DIAGNOSIS — O210 Mild hyperemesis gravidarum: Secondary | ICD-10-CM | POA: Insufficient documentation

## 2012-07-16 ENCOUNTER — Inpatient Hospital Stay (HOSPITAL_COMMUNITY): Payer: BC Managed Care – PPO

## 2012-07-16 DIAGNOSIS — O210 Mild hyperemesis gravidarum: Secondary | ICD-10-CM

## 2012-07-16 DIAGNOSIS — R2230 Localized swelling, mass and lump, unspecified upper limb: Secondary | ICD-10-CM | POA: Diagnosis present

## 2012-07-16 HISTORY — DX: Mild hyperemesis gravidarum: O21.0

## 2012-07-16 LAB — CBC
Hemoglobin: 12.3 g/dL (ref 12.0–15.0)
MCH: 27.6 pg (ref 26.0–34.0)
MCHC: 35 g/dL (ref 30.0–36.0)
MCV: 78.9 fL (ref 78.0–100.0)
Platelets: 279 10*3/uL (ref 150–400)

## 2012-07-16 LAB — URINALYSIS, ROUTINE W REFLEX MICROSCOPIC
Bilirubin Urine: NEGATIVE
Ketones, ur: NEGATIVE mg/dL
Leukocytes, UA: NEGATIVE
Nitrite: NEGATIVE
Protein, ur: NEGATIVE mg/dL

## 2012-07-16 LAB — URINE MICROSCOPIC-ADD ON

## 2012-07-16 MED ORDER — PANTOPRAZOLE SODIUM 40 MG IV SOLR
40.0000 mg | INTRAVENOUS | Status: DC
  Administered 2012-07-16: 40 mg via INTRAVENOUS
  Filled 2012-07-16: qty 40

## 2012-07-16 MED ORDER — PROMETHAZINE HCL 25 MG RE SUPP: 25.0000 mg | Freq: Four times a day (QID) | RECTAL | Status: DC | PRN

## 2012-07-16 MED ORDER — LACTATED RINGERS IV SOLN
INTRAVENOUS | Status: DC
  Administered 2012-07-16 (×2): via INTRAVENOUS

## 2012-07-16 MED ORDER — LACTATED RINGERS IV BOLUS (SEPSIS)
500.0000 mL | Freq: Once | INTRAVENOUS | Status: AC
  Administered 2012-07-16: 1000 mL via INTRAVENOUS

## 2012-07-16 MED ORDER — ONDANSETRON HCL 4 MG/2ML IJ SOLN
4.0000 mg | Freq: Three times a day (TID) | INTRAMUSCULAR | Status: DC
  Administered 2012-07-16: 4 mg via INTRAVENOUS
  Filled 2012-07-16 (×2): qty 2

## 2012-07-16 NOTE — MAU Note (Signed)
Nausea and vomiting for the past two weeks.  Denies any pain or bleeding

## 2012-07-16 NOTE — MAU Provider Note (Signed)
History   37 yo G5P3013 at 8 6/7 weeks presented unannounced c/o N/V x 2 weeks, unable to keep f/f down.  On Phenergan at home, but patient reports it ineffective.  Had been using Zofran without benefit.  Also c/o reflux.  Denies pain, bleeding, dysuria, or any other issues.  Hx recent SAB 02/2012, with complete resolution of QHCG levels 04/2012.  Reports had hyperemesis with all previous pregnancies, with resolution by 3-4 months.  Patient Active Problem List  Diagnosis  . SAB (spontaneous abortion) October 2013  . Sickle cell trait  . Hyperemesis arising during pregnancy  . Right axillary mass  Mass noted on [place] 04/2012 US--thought to be normal axillary lymph node.  Mammogram deferred until completion of pregnancy.     OB History    Grav Para Term Preterm Abortions TAB SAB Ect Mult Living   5 3 3  1  1   3       No past medical history on file.  No past surgical history on file.  Family History  Problem Relation Age of Onset  . Other Neg Hx   . Hypertension Mother   . Hypertension Father     History  Substance Use Topics  . Smoking status: Never Smoker   . Smokeless tobacco: Never Used  . Alcohol Use: No    Allergies: No Known Allergies  Prescriptions prior to admission  Medication Sig Dispense Refill  . ibuprofen (ADVIL,MOTRIN) 200 MG tablet Take 200 mg by mouth every 6 (six) hours as needed.      Marland Kitchen omeprazole (PRILOSEC) 20 MG capsule       . ondansetron (ZOFRAN) 8 MG tablet Take 1 tablet (8 mg total) by mouth every 8 (eight) hours as needed for nausea.  36 tablet  2  . Prenatal Vit-Fe Fumarate-FA (PRENATAL MULTIVITAMIN) TABS Take 1 tablet by mouth daily.      . promethazine (PHENERGAN) 25 MG tablet Take 1 tablet (25 mg total) by mouth every 8 (eight) hours as needed for nausea.  20 tablet  0  . promethazine (PHENERGAN) 25 MG tablet Take 1 tablet (25 mg total) by mouth every 4 (four) hours as needed for nausea.  30 tablet  2     Physical Exam   Last menstrual  period 05/15/2012.  Chest clear Heart RRR without murmur Abd soft, NT Pelvic--deferred Ext WNL   ED Course  1st trimester pregnancy N/V  Recent SAB [place] trait  Plan: IV hydration Zofran IV Protonix IV CBC, QHCG UA [place] for viability    Jahsiah Carpenter CNM, [place] 07/16/2012 12:27 AM   Addendum: Returned from [place].  Feeling much better--is completing 2nd bag of IVF, had Zofran and Protonix IV.  IMPRESSION:  Single living intrauterine gestation with estimated gestational age  of [redacted] weeks 6 days by this ultrasound.  No evidence of subchorionic hemorrhage.  Original Report Authenticated By: Harmon Pier, M.D.  Results for orders placed during the hospital encounter of 07/15/12 (from the past 24 hour(s))  URINALYSIS, ROUTINE W REFLEX MICROSCOPIC     Status: Abnormal   Collection Time   07/15/12 11:55 PM      Component Value Range   Color, Urine YELLOW  YELLOW   APPearance CLEAR  CLEAR   Specific Gravity, Urine 1.025  1.005 - 1.030   pH 5.5  5.0 - 8.0   Glucose, UA NEGATIVE  NEGATIVE mg/dL   Hgb urine dipstick SMALL (*) NEGATIVE   Bilirubin Urine NEGATIVE  NEGATIVE   Ketones,  ur NEGATIVE  NEGATIVE mg/dL   Protein, ur NEGATIVE  NEGATIVE mg/dL   Urobilinogen, UA 0.2  0.0 - 1.0 mg/dL   Nitrite NEGATIVE  NEGATIVE   Leukocytes, UA NEGATIVE  NEGATIVE  URINE MICROSCOPIC-ADD ON     Status: Normal   Collection Time   07/15/12 11:55 PM      Component Value Range   Squamous Epithelial / LPF RARE  RARE   WBC, UA 0-2  <3 WBC/hpf   RBC / HPF 0-2  <3 RBC/hpf   Bacteria, UA RARE  RARE  HCG, QUANTITATIVE, PREGNANCY     Status: Abnormal   Collection Time   07/16/12 12:15 AM      Component Value Range   hCG, Beta Chain, [place] Longest 147829 (*) <5 mIU/mL  CBC     Status: Abnormal   Collection Time   07/16/12 12:15 AM      Component Value Range   WBC 7.3  4.0 - 10.5 K/uL   RBC 4.45  3.87 - 5.11 MIL/uL   Hemoglobin 12.3  12.0 - 15.0 g/dL   HCT 56.2 (*) 13.0 - 86.5 %   MCV 78.9  78.0 - 100.0 fL     MCH 27.6  26.0 - 34.0 pg   MCHC 35.0  30.0 - 36.0 g/dL   RDW 78.4  69.6 - 29.5 %   Platelets 279  150 - 400 K/uL   D/C'd home with Rx for Phenergan suppositories (patient reports these worked better than po route in last pregnancy), sent to patient's pharmacy. D/C instructions on hyperemesis to patient. F/U as scheduled on 2/10 for NOB. Call prn.  Nigel Bridgeman, CNM, [place] 07/16/12 2:30am

## 2012-07-18 NOTE — MAU Note (Signed)
Late entry:  Terbutaline 1ml given IV @ 0033 on Jul 15, 2012.

## 2012-07-24 ENCOUNTER — Inpatient Hospital Stay (HOSPITAL_COMMUNITY)
Admission: AD | Admit: 2012-07-24 | Discharge: 2012-07-24 | Disposition: A | Discharge status: Home / Self Care | Payer: BC Managed Care – PPO | Source: Ambulatory Visit | Attending: Obstetrics and Gynecology | Admitting: Obstetrics and Gynecology

## 2012-07-24 ENCOUNTER — Encounter (HOSPITAL_COMMUNITY): Payer: Self-pay | Admitting: *Deleted

## 2012-07-24 ENCOUNTER — Telehealth: Payer: Self-pay | Admitting: Obstetrics and Gynecology

## 2012-07-24 ENCOUNTER — Ambulatory Visit: Payer: BC Managed Care – PPO | Admitting: Obstetrics and Gynecology

## 2012-07-24 VITALS — BP 92/60 | Wt 202.0 lb

## 2012-07-24 DIAGNOSIS — O210 Mild hyperemesis gravidarum: Secondary | ICD-10-CM | POA: Insufficient documentation

## 2012-07-24 DIAGNOSIS — R2230 Localized swelling, mass and lump, unspecified upper limb: Secondary | ICD-10-CM

## 2012-07-24 DIAGNOSIS — R1110 Vomiting, unspecified: Secondary | ICD-10-CM

## 2012-07-24 DIAGNOSIS — D573 Sickle-cell trait: Secondary | ICD-10-CM

## 2012-07-24 LAB — URINE MICROSCOPIC-ADD ON

## 2012-07-24 LAB — URINALYSIS, ROUTINE W REFLEX MICROSCOPIC
Glucose, UA: NEGATIVE mg/dL
Ketones, ur: NEGATIVE mg/dL
Protein, ur: NEGATIVE mg/dL

## 2012-07-24 MED ORDER — DEXTROSE 5 % IN LACTATED RINGERS IV BOLUS
500.0000 mL | Freq: Once | INTRAVENOUS | Status: AC
  Administered 2012-07-24: 500 mL via INTRAVENOUS

## 2012-07-24 MED ORDER — ONDANSETRON HCL 4 MG/2ML IJ SOLN
4.0000 mg | Freq: Once | INTRAMUSCULAR | Status: AC
  Administered 2012-07-24: 4 mg via INTRAVENOUS
  Filled 2012-07-24: qty 2

## 2012-07-24 MED ORDER — PANTOPRAZOLE SODIUM 40 MG IV SOLR
40.0000 mg | Freq: Once | INTRAVENOUS | Status: AC
  Administered 2012-07-24: 40 mg via INTRAVENOUS
  Filled 2012-07-24: qty 40

## 2012-07-24 NOTE — Telephone Encounter (Signed)
Pt walked in and states that she is not feeling well.  Has been having hyperemesis with this current pregnancy and was recently seen in MAU for IV fluids.  Pt states she has only eaten a piece of bread and drank some tea.  Pt offered an appt for today.  Pt scheduled an appt to see AVS today @ 1430 to discuss.  Pt voices agreement.

## 2012-07-24 NOTE — Progress Notes (Signed)
[redacted]w[redacted]d  Pt was worked in today because of excessive vomiting up to 5 times a day. Pt is eating cracker and sometimes fruit and bread.  Pt states she is drinking water , but not able to hold fluids sometimes. Pt is taking phenergan it does help.

## 2012-07-24 NOTE — Progress Notes (Signed)
[redacted]w[redacted]d Complains of a excessive vomiting.  Unable to keep food down.  She was hospitalized last week. Appears weak and listless. Chest: Clear Heart: Regular rate and rhythm. Abdomen soft and nontender. Urinalysis: 2+ blood.  Negative key tones. To [facility] for IV fluids. Dr. Stefano Gaul

## 2012-07-24 NOTE — MAU Provider Note (Signed)
S: pt feels better after IVF's and zofran, denies any dysuria or pain  O: VSS PE NAD Lungs clear Heart RRR abd soft NT Pelvic deferred Ext WNL  UA w mod hgb, s.g. 1.025, otherwise neg   A: 10wks  Ongoing hyperemesis   P: discharge home stable condition rv'd diet recommendations, including eating light every 2-3 hours, drinking sips all day Take zofran and phenergan ATC Take pepsid BID Keep scheduled office visit

## 2012-07-24 NOTE — MAU Note (Signed)
Seen at office today ([facility]).  Sent over for IV fluids. Has been taking meds at home, last dose  0700.

## 2012-07-29 ENCOUNTER — Ambulatory Visit: Payer: BC Managed Care – PPO

## 2012-07-29 ENCOUNTER — Ambulatory Visit: Payer: BC Managed Care – PPO | Admitting: Obstetrics and Gynecology

## 2012-07-29 ENCOUNTER — Encounter: Payer: Self-pay | Admitting: Obstetrics and Gynecology

## 2012-07-29 ENCOUNTER — Other Ambulatory Visit: Payer: Self-pay

## 2012-07-29 VITALS — BP 110/70 | Wt 202.0 lb

## 2012-07-29 DIAGNOSIS — IMO0002 Reserved for concepts with insufficient information to code with codable children: Secondary | ICD-10-CM

## 2012-07-29 DIAGNOSIS — Z124 Encounter for screening for malignant neoplasm of cervix: Secondary | ICD-10-CM

## 2012-07-29 DIAGNOSIS — O26849 Uterine size-date discrepancy, unspecified trimester: Secondary | ICD-10-CM

## 2012-07-29 DIAGNOSIS — O0990 Supervision of high risk pregnancy, unspecified, unspecified trimester: Secondary | ICD-10-CM

## 2012-07-29 DIAGNOSIS — Z3490 Encounter for supervision of normal pregnancy, unspecified, unspecified trimester: Secondary | ICD-10-CM

## 2012-07-29 DIAGNOSIS — D573 Sickle-cell trait: Secondary | ICD-10-CM

## 2012-07-29 HISTORY — DX: Reserved for concepts with insufficient information to code with codable children: IMO0002

## 2012-07-29 LAB — POCT URINALYSIS DIPSTICK
Leukocytes, UA: NEGATIVE
Nitrite, UA: NEGATIVE
Urobilinogen, UA: NEGATIVE
pH, UA: 5

## 2012-07-29 MED ORDER — PRENATAL MULTIVITAMIN CH: 1.0000 | ORAL_TABLET | Freq: Every day | ORAL | Status: DC

## 2012-07-29 NOTE — Patient Instructions (Addendum)
Sickle Cell Anemia Sickle cell anemia needs regular medical care by your caregiver and awareness by you when to seek medical care. Pain is a common problem in children with sickle cell disease. This usually starts at less than 39 year of age. Pain can occur nearly anywhere in the body, but most commonly happens in the extremities, back, chest, or belly (abdomen). Pain episodes can start suddenly or may follow an illness. These attacks can appear as decreased activity, loss of appetite, change in behavior, or simply complaints of pain. DIAGNOSIS   Specialized blood and gene testing can help make this diagnosis early in the disease. Blood tests may then be done to watch blood levels.  Specialized brain scans are done when there are problems in the brain during a crisis.  Lung testing may be done later in the disease. HOME CARE INSTRUCTIONS   Maintain good hydration. Increase your child's fluid intake in hot weather and during exercise.  Avoid smoking around your child. Smoking lowers the oxygen in the blood and can cause sickling.  Control pain. Only give your child over-the-counter or prescription medicines for pain, discomfort, or fever as directed by their caregiver. Do not give aspirin to children because of the association with Reye's syndrome.  Keep regular health care checks to keep a proper red blood cell (hemoglobin) level. A moderate anemia level protects against sickling crises.  You or your child should receive all the same immunizations and care as the people around them.  Moms should breastfeed their babies if possible. Use formulas with iron added if breastfeeding is not possible. Additional iron should not be given unless there is a lack of it. People with SCD build up iron faster than normal. Give folic acid and additional vitamins as directed.  If you or your child has been prescribed antibiotics or other medications to prevent problems, give them as directed.  Summer camps  are available for children with SCD. They may help young people deal with their disease. The camps introduce them to other children with the same condition.  Young people with SCD may become frustrated or angry at their disease. This can cause rebellion and refusal to follow medical care. Help groups or counseling may help with these problems.  Make sure your child wears a medical alert bracelet. When traveling, keep your medical information, caregiver's names, and the medications your child takes with you at all times. SEEK IMMEDIATE MEDICAL CARE IF:   You or your child feels dizzy or faint.  You or your child develops a new onset of abdominal pain, especially on the left side near the stomach area.  You or your child develops a persistent, often uncomfortable and painful penile erection. This is called priapism. Always check young boys for this. It is often embarrassing for them and they may not bring it to your attention. This is a medical emergency and needs immediate treatment. If this is not treated it will lead to impotence.  You or your child develops numbness in or has a hard time moving arms and legs.  You or your child has a hard time with speech.  You have a fever.  You or your child develops signs of infection (chills, lethargy, irritability, poor eating, vomiting). The younger the child, the more you should be concerned.  With fevers, do not give medications to lower the fever right away. This could cover up a problem that is developing. Notify your caregiver.  You or your child develops pain that is  not helped with medicine.  You or your child develops shortness of breath, or is coughing up pus-like or bloody sputum.  You or your child develops any problems that are new and are causing you to worry. Document Released: 09/13/2005 Document Revised: 08/28/2011 Document Reviewed: 11/03/2009 [facility] Patient Information 2013 [place], [place]. ABCs of Pregnancy A Antepartum  care is very important. Be sure you see your doctor and get prenatal care as soon as you think you are pregnant. At this time, you will be tested for infection, genetic abnormalities and potential problems with you and the pregnancy. This is the time to discuss diet, exercise, work, medications, labor, pain medication during labor and the possibility of a cesarean delivery. Ask any questions that may concern you. It is important to see your doctor regularly throughout your pregnancy. Avoid exposure to toxic substances and chemicals - such as cleaning solvents, lead and mercury, some insecticides, and paint. Pregnant women should avoid exposure to paint fumes, and fumes that cause you to feel ill, dizzy or faint. When possible, it is a good idea to have a pre-pregnancy consultation with your caregiver to begin some important recommendations your caregiver suggests such as, taking folic acid, exercising, quitting smoking, avoiding alcoholic beverages, etc. B Breastfeeding is the healthiest choice for both you and your baby. It has many nutritional benefits for the baby and health benefits for the mother. It also creates a very tight and loving bond between the baby and mother. Talk to your doctor, your family and friends, and your employer about how you choose to feed your baby and how they can support you in your decision. Not all birth defects can be prevented, but a woman can take actions that may increase her chance of having a healthy baby. Many birth defects happen very early in pregnancy, sometimes before a woman even knows she is pregnant. Birth defects or abnormalities of any child in your or the father's family should be discussed with your caregiver. Get a good support bra as your breast size changes. Wear it especially when you exercise and when nursing.  C Celebrate the news of your pregnancy with the your spouse/father and family. Childbirth classes are helpful to take for you and the spouse/father  because it helps to understand what happens during the pregnancy, labor and delivery. Cesarean delivery should be discussed with your doctor so you are prepared for that possibility. The pros and cons of circumcision if it is a boy, should be discussed with your pediatrician. Cigarette smoking during pregnancy can result in low birth weight babies. It has been associated with infertility, miscarriages, tubal pregnancies, infant death (mortality) and poor health (morbidity) in childhood. Additionally, cigarette smoking may cause long-term learning disabilities. If you smoke, you should try to quit before getting pregnant and not smoke during the pregnancy. Secondary smoke may also harm a mother and her developing baby. It is a good idea to ask people to stop smoking around you during your pregnancy and after the baby is born. Extra calcium is necessary when you are pregnant and is found in your prenatal vitamin, in dairy products, green leafy vegetables and in calcium supplements. D A healthy diet according to your current weight and height, along with vitamins and mineral supplements should be discussed with your caregiver. Domestic abuse or violence should be made known to your doctor right away to get the situation corrected. Drink more water when you exercise to keep hydrated. Discomfort of your back and legs usually develops  and progresses from the middle of the second trimester through to delivery of the baby. This is because of the enlarging baby and uterus, which may also affect your balance. Do not take illegal drugs. Illegal drugs can seriously harm the baby and you. Drink extra fluids (water is best) throughout pregnancy to help your body keep up with the increases in your blood volume. Drink at least 6 to 8 glasses of water, fruit juice, or milk each day. A good way to know you are drinking enough fluid is when your urine looks almost like clear water or is very light yellow.  E Eat healthy to get the  nutrients you and your unborn baby need. Your meals should include the five basic food groups. Exercise (30 minutes of light to moderate exercise a day) is important and encouraged during pregnancy, if there are no medical problems or problems with the pregnancy. Exercise that causes discomfort or dizziness should be stopped and reported to your caregiver. Emotions during pregnancy can change from being ecstatic to depression and should be understood by you, your partner and your family. F Fetal screening with ultrasound, amniocentesis and monitoring during pregnancy and labor is common and sometimes necessary. Take 400 micrograms of folic acid daily both before, when possible, and during the first few months of pregnancy to reduce the risk of birth defects of the brain and spine. All women who could possibly become pregnant should take a vitamin with folic acid, every day. It is also important to eat a healthy diet with fortified foods (enriched grain products, including cereals, rice, breads, and pastas) and foods with natural sources of folate (orange juice, green leafy vegetables, beans, peanuts, broccoli, asparagus, peas, and lentils). The father should be involved with all aspects of the pregnancy including, the prenatal care, childbirth classes, labor, delivery, and postpartum time. Fathers may also have emotional concerns about being a father, financial needs, and raising a family. G Genetic testing should be done appropriately. It is important to know your family and the father's history. If there have been problems with pregnancies or birth defects in your family, report these to your doctor. Also, genetic counselors can talk with you about the information you might need in making decisions about having a family. You can call a major medical center in your area for help in finding a board-certified genetic counselor. Genetic testing and counseling should be done before pregnancy when possible,  especially if there is a history of problems in the mother's or father's family. Certain ethnic backgrounds are more at risk for genetic defects. H Get familiar with the hospital where you will be having your baby. Get to know how long it takes to get there, the labor and delivery area, and the hospital procedures. Be sure your medical insurance is accepted there. Get your home ready for the baby including, clothes, the baby's room (when possible), furniture and car seat. Hand washing is important throughout the day, especially after handling raw meat and poultry, changing the baby's diaper or using the bathroom. This can help prevent the spread of many bacteria and viruses that cause infection. Your hair may become dry and thinner, but will return to normal a few weeks after the baby is born. Heartburn is a common problem that can be treated by taking antacids recommended by your caregiver, eating smaller meals 5 or 6 times a day, not drinking liquids when eating, drinking between meals and raising the head of your bed 2 to 3 inches.  I Insurance to cover you, the baby, doctor and hospital should be reviewed so that you will be prepared to pay any costs not covered by your insurance plan. If you do not have medical insurance, there are usually clinics and services available for you in your community. Take 30 milligrams of iron during your pregnancy as prescribed by your doctor to reduce the risk of low red blood cells (anemia) later in pregnancy. All women of childbearing age should eat a diet rich in iron. J There should be a joint effort for the mother, father and any other children to adapt to the pregnancy financially, emotionally, and psychologically during the pregnancy. Join a support group for moms-to-be. Or, join a class on parenting or childbirth. Have the family participate when possible. K Know your limits. Let your caregiver know if you experience any of the following:   Pain of any  kind.  Strong cramps.  You develop a lot of weight in a short period of time (5 pounds in 3 to 5 days).  Vaginal bleeding, leaking of amniotic fluid.  Headache, vision problems.  Dizziness, fainting, shortness of breath.  Chest pain.  Fever of 102 F (38.9 C) or higher.  Gush of clear fluid from your vagina.  Painful urination.  Domestic violence.  Irregular heartbeat (palpitations).  Rapid beating of the heart (tachycardia).  Constant feeling sick to your stomach (nauseous) and vomiting.  Trouble walking, fluid retention (edema).  Muscle weakness.  If your baby has decreased activity.  Persistent diarrhea.  Abnormal vaginal discharge.  Uterine contractions at 20-minute intervals.  Back pain that travels down your leg. L Learn and practice that what you eat and drink should be in moderation and healthy for you and your baby. Legal drugs such as alcohol and caffeine are important issues for pregnant women. There is no safe amount of alcohol a woman can drink while pregnant. Fetal alcohol syndrome, a disorder characterized by growth retardation, facial abnormalities, and central nervous system dysfunction, is caused by a woman's use of alcohol during pregnancy. Caffeine, found in tea, coffee, soft drinks and chocolate, should also be limited. Be sure to read labels when trying to cut down on caffeine during pregnancy. More than 200 foods, beverages, and over-the-counter medications contain caffeine and have a high salt content! There are coffees and teas that do not contain caffeine. M Medical conditions such as diabetes, epilepsy, and high blood pressure should be treated and kept under control before pregnancy when possible, but especially during pregnancy. Ask your caregiver about any medications that may need to be changed or adjusted during pregnancy. If you are currently taking any medications, ask your caregiver if it is safe to take them while you are pregnant or  before getting pregnant when possible. Also, be sure to discuss any herbs or vitamins you are taking. They are medicines, too! Discuss with your doctor all medications, prescribed and over-the-counter, that you are taking. During your prenatal visit, discuss the medications your doctor may give you during labor and delivery. N Never be afraid to ask your doctor or caregiver questions about your health, the progress of the pregnancy, family problems, stressful situations, and recommendation for a pediatrician, if you do not have one. It is better to take all precautions and discuss any questions or concerns you may have during your office visits. It is a good idea to write down your questions before you visit the doctor. O Over-the-counter cough and cold remedies may contain alcohol or other  ingredients that should be avoided during pregnancy. Ask your caregiver about prescription, herbs or over-the-counter medications that you are taking or may consider taking while pregnant.  P Physical activity during pregnancy can benefit both you and your baby by lessening discomfort and fatigue, providing a sense of well-being, and increasing the likelihood of early recovery after delivery. Light to moderate exercise during pregnancy strengthens the belly (abdominal) and back muscles. This helps improve posture. Practicing yoga, walking, swimming, and cycling on a stationary bicycle are usually safe exercises for pregnant women. Avoid scuba diving, exercise at high altitudes (over 3000 feet), skiing, horseback riding, contact sports, etc. Always check with your doctor before beginning any kind of exercise, especially during pregnancy and especially if you did not exercise before getting pregnant. Q Queasiness, stomach upset and morning sickness are common during pregnancy. Eating a couple of crackers or dry toast before getting out of bed. Foods that you normally love may make you feel sick to your stomach. You may need  to substitute other nutritious foods. Eating 5 or 6 small meals a day instead of 3 large ones may make you feel better. Do not drink with your meals, drink between meals. Questions that you have should be written down and asked during your prenatal visits. R Read about and make plans to baby-proof your home. There are important tips for making your home a safer environment for your baby. Review the tips and make your home safer for you and your baby. Read food labels regarding calories, salt and fat content in the food. S Saunas, hot tubs, and steam rooms should be avoided while you are pregnant. Excessive high heat may be harmful during your pregnancy. Your caregiver will screen and examine you for sexually transmitted diseases and genetic disorders during your prenatal visits. Learn the signs of labor. Sexual relations while pregnant is safe unless there is a medical or pregnancy problem and your caregiver advises against it. T Traveling long distances should be avoided especially in the third trimester of your pregnancy. If you do have to travel out of state, be sure to take a copy of your medical records and medical insurance plan with you. You should not travel long distances without seeing your doctor first. Most airlines will not allow you to travel after 36 weeks of pregnancy. Toxoplasmosis is an infection caused by a parasite that can seriously harm an unborn baby. Avoid eating undercooked meat and handling cat litter. Be sure to wear gloves when gardening. Tingling of the hands and fingers is not unusual and is due to fluid retention. This will go away after the baby is born. U Womb (uterus) size increases during the first trimester. Your kidneys will begin to function more efficiently. This may cause you to feel the need to urinate more often. You may also leak urine when sneezing, coughing or laughing. This is due to the growing uterus pressing against your bladder, which lies directly in front  of and slightly under the uterus during the first few months of pregnancy. If you experience burning along with frequency of urination or bloody urine, be sure to tell your doctor. The size of your uterus in the third trimester may cause a problem with your balance. It is advisable to maintain good posture and avoid wearing high heels during this time. An ultrasound of your baby may be necessary during your pregnancy and is safe for you and your baby. V Vaccinations are an important concern for pregnant women. Get needed  vaccines before pregnancy. Center for Disease Control ([employer]) has clear guidelines for the use of vaccines during pregnancy. Review the list, be sure to discuss it with your doctor. Prenatal vitamins are helpful and healthy for you and the baby. Do not take extra vitamins except what is recommended. Taking too much of certain vitamins can cause overdose problems. Continuous vomiting should be reported to your caregiver. Varicose veins may appear especially if there is a family history of varicose veins. They should subside after the delivery of the baby. Support hose helps if there is leg discomfort. W Being overweight or underweight during pregnancy may cause problems. Try to get within 15 pounds of your ideal weight before pregnancy. Remember, pregnancy is not a time to be dieting! Do not stop eating or start skipping meals as your weight increases. Both you and your baby need the calories and nutrition you receive from a healthy diet. Be sure to consult with your doctor about your diet. There is a formula and diet plan available depending on whether you are overweight or underweight. Your caregiver or nutritionist can help and advise you if necessary. X Avoid X-rays. If you must have dental work or diagnostic tests, tell your dentist or physician that you are pregnant so that extra care can be taken. X-rays should only be taken when the risks of not taking them outweigh the risk of  taking them. If needed, only the minimum amount of radiation should be used. When X-rays are necessary, protective lead shields should be used to cover areas of the body that are not being X-rayed. Y Your baby loves you. Breastfeeding your baby creates a loving and very close bond between the two of you. Give your baby a healthy environment to live in while you are pregnant. Infants and children require constant care and guidance. Their health and safety should be carefully watched at all times. After the baby is born, rest or take a nap when the baby is sleeping. Z Get your ZZZs. Be sure to get plenty of rest. Resting on your side as often as possible, especially on your left side is advised. It provides the best circulation to your baby and helps reduce swelling. Try taking a nap for 30 to 45 minutes in the afternoon when possible. After the baby is born rest or take a nap when the baby is sleeping. Try elevating your feet for that amount of time when possible. It helps the circulation in your legs and helps reduce swelling.  Most information courtesy of the CDC. Document Released: 06/05/2005 Document Revised: 08/28/2011 Document Reviewed: 02/17/2009 [facility] Patient Information 2013 [place], [place].

## 2012-07-29 NOTE — Progress Notes (Signed)
[redacted]w[redacted]d  [facility] NEW OB EXAMINATION   Kirsten Hoover is a 37 y.o. female, [id number], who presents at [redacted]w[redacted]d gestation for a new obstetrical examination.   Her due date is February 19, 2013 by last menstrual period.  She had an ultrasound performed on July 16, 2012 at which time she was 8 weeks and 6 days.  This confirms her due date.  The patient received IV fluids for hyperemesis.  She feels much better at this time.  She feels that she is ready to return to work.  The following portions of the patient's history were reviewed and updated as appropriate: allergies, current medications, past family history, past medical history, past social history, past surgical history and problem list.  OB History   Grav Para Term Preterm Abortions TAB SAB Ect Mult Living   5 3 3  1  1   3       No past medical history on file.  Past Surgical History  Procedure Laterality Date  . No past surgeries      Family History  Problem Relation Age of Onset  . Other Neg Hx   . Hypertension Mother   . Hypertension Father     Social History:  reports that she has never smoked. She has never used smokeless tobacco. She reports that she does not drink alcohol or use illicit drugs.  Allergies: No Known Allergies  Medications: prenatal vitamins, Phenergan suppositories   Objective:    BP 110/70  Wt 202 lb (91.627 kg)  BMI 29.82 kg/m2  LMP 05/15/2012    Weight:  Wt Readings from Last 1 Encounters:  07/29/12 202 lb (91.627 kg)          BMI: Body mass index is 29.82 kg/(m^2).  General Appearance: Alert, appropriate appearance for age. No acute distress HEENT: Grossly normal Neck / Thyroid: Supple, no masses, nodes or enlargement Lungs: clear to auscultation bilaterally Back: No CVA tenderness Breast Exam: No masses or nodes.No dimpling, nipple retraction or discharge. Cardiovascular: Regular rate and rhythm. S1, S2, no murmur Gastrointestinal: Soft, non-tender, no masses or organomegaly.                                Fundal height: 10 weeks                               Fetal heart tones audible: 173 bpm by ultrasound  ++++++++++++++++++++++++++++++++++++++++++++++++++++++++  Pelvic Exam: External genitalia: normal general appearance Vaginal: normal without tenderness, induration or masses and relaxation: Yes Cervix: normal appearance Adnexa: normal bimanual exam Uterus: gravid, nontender, 10 weeks size  ++++++++++++++++++++++++++++++++++++++++++++++++++++++++  Lymphatic Exam: Non-palpable nodes in neck, clavicular, axillary, or inguinal regions Neurologic: Normal speech, no tremor  Psychiatric: Alert and oriented, appropriate affect.  Prenatal labs: ABO, Rh: O/POS/-- (01/09 1522) Antibody: NEG (01/09 1522) Rubella:  immune RPR: NON REAC (01/09 1522)  HBsAg: NEGATIVE (01/09 1522)  HIV: NON REACTIVE (01/09 1522)  GBS:   pending until the third trimester Sickle cell: Sickle cell trait Hemoglobin: 12.3 Platelet: 279,000 Varicella-zoster: Immune Urine culture: Negative    Assessment:   37 y.o. female G5P3013 at [redacted]w[redacted]d gestation ( EDC is February 19, 2013) by: Normal Last menstrual period: yes  Age greater than 35  Sickle cell trait  hyperemesis-improved   Plan:    GC and Chlamydia sent.  Pap smear sent.  We discussed routine pregnancy  issues:  Toxoplasmosis was reviewed.  The patient was told to avoid cat liter boxes and feces.  The patient was told to avoid predator fish including tuna because of our concerns for mercury consumption.  The patient was told to avoid soft cheeses.  The patient was told to be sure that all lunch meats are well cooked.  Genetic screening was discussed. The patient will decide if she is interested in amniocentesis, first trimester screen, quad screen, or Harmony testing.  Our model for pregnancy management was reviewed.  Proper diet and exercise reviewed.  Return to office in 4 weeks.  Medications  include:  Prenatal vitamins Phenergan suppositories  ABC's of pregnancy given.  Information given about sickle cell trait.  The father of the baby will be tested.  No given to return to work tomorrow.  Wet prep next visit.  Mylinda Latina.D.

## 2012-07-29 NOTE — Progress Notes (Signed)
[redacted]w[redacted]d Last pap smear 08/2011 WNL LMP 05/15/2012

## 2012-07-29 NOTE — Addendum Note (Signed)
Addended by: Tim Lair on: 07/29/2012 05:02 PM   Modules accepted: Orders

## 2012-07-30 LAB — PAP IG, CT-NG, RFX HPV ASCU

## 2012-08-08 LAB — US OB COMP LESS 14 WKS

## 2012-08-11 ENCOUNTER — Other Ambulatory Visit (HOSPITAL_COMMUNITY): Payer: Self-pay | Admitting: Obstetrics and Gynecology

## 2012-08-12 ENCOUNTER — Other Ambulatory Visit (HOSPITAL_COMMUNITY): Payer: Self-pay | Admitting: Obstetrics and Gynecology

## 2012-08-23 ENCOUNTER — Other Ambulatory Visit (HOSPITAL_COMMUNITY): Payer: Self-pay | Admitting: Obstetrics and Gynecology

## 2012-08-23 ENCOUNTER — Telehealth: Payer: Self-pay | Admitting: Obstetrics and Gynecology

## 2012-08-23 NOTE — Telephone Encounter (Signed)
TC to pt. Regarding RF Phenergan. Mailbox full. Unable to leave message.

## 2012-08-26 ENCOUNTER — Telehealth: Payer: Self-pay | Admitting: Obstetrics and Gynecology

## 2012-08-26 ENCOUNTER — Ambulatory Visit: Payer: BC Managed Care – PPO | Admitting: Certified Nurse Midwife

## 2012-08-26 VITALS — BP 110/60 | Wt 212.0 lb

## 2012-08-26 DIAGNOSIS — Z331 Pregnant state, incidental: Secondary | ICD-10-CM

## 2012-08-26 DIAGNOSIS — Z760 Encounter for issue of repeat prescription: Secondary | ICD-10-CM

## 2012-08-26 MED ORDER — PROMETHAZINE HCL 25 MG RE SUPP: RECTAL | Status: DC

## 2012-08-26 NOTE — Progress Notes (Signed)
Needs wet prep per last ov note No complaints

## 2012-08-26 NOTE — Progress Notes (Signed)
[redacted]w[redacted]d Pt is feeling well.  1 episode of lower abdominal cramping today reported. Denies VB.  Reassurance given, encouraged to increase H2O intake and rest when this happens. Wet prep completed per Dr Debria Garret note  -- Neg Will RTC in 4 weeks for ROB and anatomy scan

## 2012-08-26 NOTE — Telephone Encounter (Signed)
VM from pt. 08/24/12. RF supp for nausea. Pt [phone]

## 2012-08-27 LAB — POCT WET PREP (WET MOUNT): Trichomonas Wet Prep HPF POC: NEGATIVE

## 2012-08-27 NOTE — Addendum Note (Signed)
Addended byVic Ripper on: 08/27/2012 08:18 AM   Modules accepted: Orders

## 2012-11-05 ENCOUNTER — Emergency Department (HOSPITAL_BASED_OUTPATIENT_CLINIC_OR_DEPARTMENT_OTHER)
Admission: EM | Admit: 2012-11-05 | Discharge: 2012-11-05 | Disposition: A | Discharge status: Home / Self Care | Payer: BC Managed Care – PPO | Attending: Emergency Medicine | Admitting: Emergency Medicine

## 2012-11-05 ENCOUNTER — Encounter (HOSPITAL_BASED_OUTPATIENT_CLINIC_OR_DEPARTMENT_OTHER): Payer: Self-pay | Admitting: *Deleted

## 2012-11-05 DIAGNOSIS — O9989 Other specified diseases and conditions complicating pregnancy, childbirth and the puerperium: Secondary | ICD-10-CM | POA: Insufficient documentation

## 2012-11-05 DIAGNOSIS — R209 Unspecified disturbances of skin sensation: Secondary | ICD-10-CM | POA: Insufficient documentation

## 2012-11-05 DIAGNOSIS — Z3490 Encounter for supervision of normal pregnancy, unspecified, unspecified trimester: Secondary | ICD-10-CM

## 2012-11-05 DIAGNOSIS — F419 Anxiety disorder, unspecified: Secondary | ICD-10-CM

## 2012-11-05 DIAGNOSIS — F411 Generalized anxiety disorder: Secondary | ICD-10-CM | POA: Insufficient documentation

## 2012-11-05 LAB — URINALYSIS, ROUTINE W REFLEX MICROSCOPIC
Bilirubin Urine: NEGATIVE
Nitrite: NEGATIVE
Specific Gravity, Urine: 1.01 (ref 1.005–1.030)
pH: 7 (ref 5.0–8.0)

## 2012-11-05 LAB — URINE MICROSCOPIC-ADD ON

## 2012-11-05 NOTE — ED Provider Notes (Signed)
History     CSN: [id number]  Arrival date & time 11/05/12  [id number]   First MD Initiated Contact with Patient 11/05/12 [phone]      Chief Complaint  Patient presents with  . Anxiety    (Consider location/radiation/quality/duration/timing/severity/associated sxs/prior treatment) HPI Comments: Patient presents with 2 hours of diffuse body tingling that started after she was in a hearted argument with her coworker. The symptoms have since improved. She is 7 months pregnant. She denies any chest pain, shortness of breath, nausea or vomiting. She denies any abdominal pain, contractions or vaginal bleeding. She denies any previous history of anxiety. She's had good by mouth intake and urine output. Denies any headache focal weakness, numbness or tingling now. Denies any problems with this pregnancy.  Sees [facility].  The history is provided by the patient and the EMS personnel.    History reviewed. No pertinent past medical history.  Past Surgical History  Procedure Laterality Date  . No past surgeries      Family History  Problem Relation Age of Onset  . Other Neg Hx   . Hypertension Mother   . Hypertension Father     History  Substance Use Topics  . Smoking status: Never Smoker   . Smokeless tobacco: Never Used  . Alcohol Use: No    OB History   Grav Para Term Preterm Abortions TAB SAB Ect Mult Living   5 3 3  1  1   3       Review of Systems  Constitutional: Negative for fever and activity change.  HENT: Negative for congestion and rhinorrhea.   Respiratory: Negative for cough, chest tightness and shortness of breath.   Cardiovascular: Negative for chest pain.  Gastrointestinal: Negative for nausea, vomiting and abdominal pain.  Genitourinary: Negative for dysuria, hematuria, vaginal bleeding and vaginal discharge.  Musculoskeletal: Negative for back pain.  Neurological: Negative for dizziness, weakness and headaches.  Psychiatric/Behavioral: The patient is  nervous/anxious.   A complete 10 system review of systems was obtained and all systems are negative except as noted in the HPI and PMH.    Allergies  Review of patient's allergies indicates no known allergies.  Home Medications   Current Outpatient Rx  Name  Route  Sig  Dispense  Refill  . Prenatal Vit-Fe Fumarate-FA (PRENATAL MULTIVITAMIN) TABS   Oral   Take 1 tablet by mouth daily.   30 tablet   11   . promethazine (PROMETHEGAN) 25 MG suppository      INSERT 1 SUPPOSITORY RECTALLY EVERY 6 HOURS AS NEEDED FOR NAUSEA   21 suppository   0     BP 114/76  Pulse 88  Temp(Src) 98.4 F (36.9 C) (Oral)  Resp 16  SpO2 100%  LMP 05/15/2012  Physical Exam  Constitutional: She is oriented to person, place, and time. She appears well-developed and well-nourished. No distress.  HENT:  Head: Normocephalic and atraumatic.  Mouth/Throat: Oropharynx is clear and moist.  Eyes: Conjunctivae and EOM are normal. Pupils are equal, round, and reactive to light.  Neck: Normal range of motion. Neck supple.  Cardiovascular: Normal rate, regular rhythm and normal heart sounds.   No murmur heard. Pulmonary/Chest: Effort normal and breath sounds normal. No respiratory distress.  Abdominal: Soft. There is no tenderness. There is no rebound and no guarding.  Gravid  Musculoskeletal: Normal range of motion. She exhibits edema. She exhibits no tenderness.  Trace pedal edema  Neurological: She is alert and oriented to person, place, and  time. No cranial nerve deficit. She exhibits normal muscle tone. Coordination normal.  Skin: Skin is warm.    ED Course  Procedures (including critical care time)  Labs Reviewed  URINALYSIS, ROUTINE W REFLEX MICROSCOPIC - Abnormal; Notable for the following:    Hgb urine dipstick MODERATE (*)    All other components within normal limits  URINE MICROSCOPIC-ADD ON - Abnormal; Notable for the following:    Bacteria, UA FEW (*)    All other components within  normal limits   No results found.   1. Anxiety   2. Pregnancy       MDM  Diffuse body tingling after argument. Now resolved. No chest pain or shortness of breath. 7 months pregnant with no vaginal bleeding or abdominal pain. FHT 148 UA dipstick with hemoglobin, but no rbcs on micro.  No proteinuria.  Discussed with Dr. Estanislado Pandy of [facility]. She will ensure patient gets followup this week.  No chest pain, SOB, hypoxia or tachycardia to suggest PE. patient is feeling better. Denies chest pain or shortness of breath. Denies abdominal pain or vaginal bleeding. She will followup with obstetrics this week.   Date: 11/05/2012  Rate: 88  Rhythm: normal sinus rhythm  QRS Axis: normal  Intervals: normal  ST/T Wave abnormalities: normal  Conduction Disutrbances:none  Narrative Interpretation:   Old EKG Reviewed: none available    Glynn Octave, MD 11/05/12 1326

## 2012-11-05 NOTE — ED Notes (Signed)
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

## 2012-11-05 NOTE — ED Notes (Addendum)
EMS reports that the patient was at work and a coworker upset her.  Reports patient was anxious and crying on arrival, was able to calm her down, but continued to have periods of tearfulness while being transported to ED. VS: BP: 158/98,  P: 96, R:18, 02 Sat: 98%.

## 2012-11-05 NOTE — ED Notes (Signed)
Pt tolerating ginger ale is talkative with visitors in the room

## 2012-11-05 NOTE — ED Notes (Signed)
Pt discharged ambulatory to lobby with family member unable to close out chart as registration is working in chart.

## 2012-11-19 ENCOUNTER — Encounter: Payer: Self-pay | Admitting: Obstetrics and Gynecology

## 2013-02-21 ENCOUNTER — Inpatient Hospital Stay (HOSPITAL_COMMUNITY)
Admission: AD | Admit: 2013-02-21 | Discharge: 2013-02-23 | DRG: 372 | Disposition: A | Discharge status: Home / Self Care | Payer: BC Managed Care – PPO | Source: Ambulatory Visit | Attending: Obstetrics and Gynecology | Admitting: Obstetrics and Gynecology

## 2013-02-21 ENCOUNTER — Encounter (HOSPITAL_COMMUNITY): Payer: Self-pay | Admitting: *Deleted

## 2013-02-21 ENCOUNTER — Inpatient Hospital Stay (HOSPITAL_COMMUNITY): Payer: BC Managed Care – PPO | Admitting: Anesthesiology

## 2013-02-21 ENCOUNTER — Encounter (HOSPITAL_COMMUNITY): Payer: Self-pay | Admitting: Anesthesiology

## 2013-02-21 DIAGNOSIS — N898 Other specified noninflammatory disorders of vagina: Secondary | ICD-10-CM

## 2013-02-21 DIAGNOSIS — O99892 Other specified diseases and conditions complicating childbirth: Secondary | ICD-10-CM | POA: Diagnosis present

## 2013-02-21 DIAGNOSIS — O4210 Premature rupture of membranes, onset of labor more than 24 hours following rupture, unspecified weeks of gestation: Secondary | ICD-10-CM | POA: Diagnosis not present

## 2013-02-21 DIAGNOSIS — N390 Urinary tract infection, site not specified: Secondary | ICD-10-CM

## 2013-02-21 DIAGNOSIS — D573 Sickle-cell trait: Secondary | ICD-10-CM

## 2013-02-21 DIAGNOSIS — O4290 Premature rupture of membranes, unspecified as to length of time between rupture and onset of labor, unspecified weeks of gestation: Secondary | ICD-10-CM | POA: Diagnosis present

## 2013-02-21 DIAGNOSIS — O09529 Supervision of elderly multigravida, unspecified trimester: Secondary | ICD-10-CM | POA: Diagnosis present

## 2013-02-21 DIAGNOSIS — O210 Mild hyperemesis gravidarum: Secondary | ICD-10-CM

## 2013-02-21 DIAGNOSIS — M5430 Sciatica, unspecified side: Secondary | ICD-10-CM | POA: Insufficient documentation

## 2013-02-21 DIAGNOSIS — Z22330 Carrier of Group B streptococcus: Secondary | ICD-10-CM

## 2013-02-21 HISTORY — DX: Sickle-cell trait: D57.3

## 2013-02-21 HISTORY — DX: Urinary tract infection, site not specified: N39.0

## 2013-02-21 HISTORY — DX: Sciatica, unspecified side: M54.30

## 2013-02-21 HISTORY — DX: Other specified noninflammatory disorders of vagina: N89.8

## 2013-02-21 HISTORY — DX: Supervision of elderly multigravida, unspecified trimester: O09.529

## 2013-02-21 LAB — AMNISURE RUPTURE OF MEMBRANE (ROM) NOT AT ARMC: Amnisure ROM: POSITIVE

## 2013-02-21 LAB — CBC
HCT: 33.9 % — ABNORMAL LOW (ref 36.0–46.0)
MCH: 28.8 pg (ref 26.0–34.0)
MCHC: 35.1 g/dL (ref 30.0–36.0)
MCV: 82.1 fL (ref 78.0–100.0)
Platelets: 277 10*3/uL (ref 150–400)
RDW: 13.4 % (ref 11.5–15.5)
WBC: 9.6 10*3/uL (ref 4.0–10.5)

## 2013-02-21 LAB — TYPE AND SCREEN: Antibody Screen: NEGATIVE

## 2013-02-21 MED ORDER — DEXTROSE 5 % IV SOLN
2.5000 10*6.[IU] | INTRAVENOUS | Status: DC
  Administered 2013-02-21 (×4): 2.5 10*6.[IU] via INTRAVENOUS
  Filled 2013-02-21 (×9): qty 2.5

## 2013-02-21 MED ORDER — OXYCODONE-ACETAMINOPHEN 5-325 MG PO TABS: 1.0000 | ORAL_TABLET | ORAL | Status: DC | PRN

## 2013-02-21 MED ORDER — PHENYLEPHRINE 40 MCG/ML (10ML) SYRINGE FOR IV PUSH (FOR BLOOD PRESSURE SUPPORT): 80.0000 ug | PREFILLED_SYRINGE | INTRAVENOUS | Status: DC | PRN

## 2013-02-21 MED ORDER — OXYTOCIN 40 UNITS IN LACTATED RINGERS INFUSION - SIMPLE MED
1.0000 m[IU]/min | INTRAVENOUS | Status: DC
  Administered 2013-02-21: 10 m[IU]/min via INTRAVENOUS
  Administered 2013-02-21: 1 m[IU]/min via INTRAVENOUS
  Administered 2013-02-21: 3 m[IU]/min via INTRAVENOUS
  Administered 2013-02-21: 6 m[IU]/min via INTRAVENOUS
  Administered 2013-02-21: 7 m[IU]/min via INTRAVENOUS
  Administered 2013-02-21: 9 m[IU]/min via INTRAVENOUS
  Administered 2013-02-21: 4 m[IU]/min via INTRAVENOUS
  Administered 2013-02-21: 8 m[IU]/min via INTRAVENOUS
  Administered 2013-02-21: 5 m[IU]/min via INTRAVENOUS

## 2013-02-21 MED ORDER — ACETAMINOPHEN 325 MG PO TABS: 650.0000 mg | ORAL_TABLET | ORAL | Status: DC | PRN

## 2013-02-21 MED ORDER — FENTANYL 2.5 MCG/ML BUPIVACAINE 1/10 % EPIDURAL INFUSION (WH - ANES)
INTRAMUSCULAR | Status: DC | PRN
  Administered 2013-02-21: 14 mL/h via EPIDURAL

## 2013-02-21 MED ORDER — OXYTOCIN 40 UNITS IN LACTATED RINGERS INFUSION - SIMPLE MED: 1.0000 m[IU]/min | INTRAVENOUS | Status: DC

## 2013-02-21 MED ORDER — LACTATED RINGERS IV SOLN
500.0000 mL | INTRAVENOUS | Status: DC | PRN
  Administered 2013-02-21: 500 mL via INTRAVENOUS

## 2013-02-21 MED ORDER — EPHEDRINE 5 MG/ML INJ
10.0000 mg | INTRAVENOUS | Status: DC | PRN
  Filled 2013-02-21: qty 4

## 2013-02-21 MED ORDER — LIDOCAINE HCL (PF) 1 % IJ SOLN
INTRAMUSCULAR | Status: DC | PRN
  Administered 2013-02-21 (×2): 4 mL

## 2013-02-21 MED ORDER — LACTATED RINGERS IV SOLN
500.0000 mL | Freq: Once | INTRAVENOUS | Status: AC
  Administered 2013-02-21: 1000 mL via INTRAVENOUS

## 2013-02-21 MED ORDER — PENICILLIN G POTASSIUM 5000000 UNITS IJ SOLR
5.0000 10*6.[IU] | Freq: Once | INTRAMUSCULAR | Status: AC
  Administered 2013-02-21: 5 10*6.[IU] via INTRAVENOUS
  Filled 2013-02-21: qty 5

## 2013-02-21 MED ORDER — DIPHENHYDRAMINE HCL 50 MG/ML IJ SOLN: 12.5000 mg | INTRAMUSCULAR | Status: DC | PRN

## 2013-02-21 MED ORDER — EPHEDRINE 5 MG/ML INJ: 10.0000 mg | INTRAVENOUS | Status: DC | PRN

## 2013-02-21 MED ORDER — FENTANYL 2.5 MCG/ML BUPIVACAINE 1/10 % EPIDURAL INFUSION (WH - ANES)
14.0000 mL/h | INTRAMUSCULAR | Status: DC | PRN
  Filled 2013-02-21: qty 125

## 2013-02-21 MED ORDER — LACTATED RINGERS IV SOLN
INTRAVENOUS | Status: DC
  Administered 2013-02-21 (×3): via INTRAVENOUS

## 2013-02-21 MED ORDER — CITRIC ACID-SODIUM CITRATE 334-500 MG/5ML PO SOLN: 30.0000 mL | ORAL | Status: DC | PRN

## 2013-02-21 MED ORDER — OXYTOCIN BOLUS FROM INFUSION
500.0000 mL | INTRAVENOUS | Status: DC
  Administered 2013-02-21: 500 mL via INTRAVENOUS

## 2013-02-21 MED ORDER — IBUPROFEN 600 MG PO TABS: 600.0000 mg | ORAL_TABLET | Freq: Four times a day (QID) | ORAL | Status: DC | PRN

## 2013-02-21 MED ORDER — LACTATED RINGERS IV SOLN
INTRAVENOUS | Status: DC
  Administered 2013-02-21: 22:00:00 via INTRAUTERINE

## 2013-02-21 MED ORDER — OXYTOCIN 40 UNITS IN LACTATED RINGERS INFUSION - SIMPLE MED
62.5000 mL/h | INTRAVENOUS | Status: DC
  Filled 2013-02-21: qty 1000

## 2013-02-21 MED ORDER — LIDOCAINE HCL (PF) 1 % IJ SOLN: 30.0000 mL | INTRAMUSCULAR | Status: DC | PRN

## 2013-02-21 MED ORDER — PHENYLEPHRINE 40 MCG/ML (10ML) SYRINGE FOR IV PUSH (FOR BLOOD PRESSURE SUPPORT)
80.0000 ug | PREFILLED_SYRINGE | INTRAVENOUS | Status: DC | PRN
  Filled 2013-02-21: qty 5

## 2013-02-21 MED ORDER — TERBUTALINE SULFATE 1 MG/ML IJ SOLN: 0.2500 mg | Freq: Once | INTRAMUSCULAR | Status: AC | PRN

## 2013-02-21 MED ORDER — ONDANSETRON HCL 4 MG/2ML IJ SOLN: 4.0000 mg | Freq: Four times a day (QID) | INTRAMUSCULAR | Status: DC | PRN

## 2013-02-21 NOTE — Anesthesia Procedure Notes (Signed)
Epidural Patient location during procedure: OB Start time: 02/21/2013 4:51 PM  Staffing Anesthesiologist: Melody Savidge A. Performed by: anesthesiologist   Preanesthetic Checklist Completed: patient identified, site marked, surgical consent, pre-op evaluation, timeout performed, IV checked, risks and benefits discussed and monitors and equipment checked  Epidural Patient position: sitting Prep: site prepped and draped and DuraPrep Patient monitoring: continuous pulse ox and blood pressure Approach: midline Injection technique: LOR air  Needle:  Needle type: Tuohy  Needle gauge: 17 G Needle length: 9 cm and 9 Needle insertion depth: 4 cm Catheter type: closed end flexible Catheter size: 19 Gauge Catheter at skin depth: 9 cm Test dose: negative and Other  Assessment Events: blood not aspirated, injection not painful, no injection resistance, negative IV test and no paresthesia  Additional Notes Patient identified. Risks and benefits discussed including failed block, incomplete  Pain control, post dural puncture headache, nerve damage, paralysis, blood pressure Changes, nausea, vomiting, reactions to medications-both toxic and allergic and post Partum back pain. All questions were answered. Patient expressed understanding and wished to proceed. Sterile technique was used throughout procedure. Epidural site was Dressed with sterile barrier dressing. No paresthesias, signs of intravascular injection Or signs of intrathecal spread were encountered.  Patient was more comfortable after the epidural was dosed. Please see RN's note for documentation of vital signs and FHR which are stable.

## 2013-02-21 NOTE — Progress Notes (Signed)
  Subjective: Pt laying comfortably in bed.  Pt reports no change in timing or intensity of UCs.  R&B of pitocin induction were reviewed with the patient -- patient verbalizes understanding of these risks and wishes to proceed.   Objective: BP 116/70  Pulse 74  Temp(Src) 98.4 F (36.9 C) (Oral)  Resp 18  Ht 5\' 9"  (1.753 m)  Wt 240 lb (108.863 kg)  BMI 35.43 kg/m2  SpO2 100%  LMP 05/15/2012      FHT:  Cat I UC:   Not tracing  SVE:   Dilation: 1 Effacement (%): 50 Station: -3 Exam by:: J. Adrine Hayworth CNM  Assessment / Plan:  Labor: IOL for PROM; Pitocin initated Preeclampsia: no s/s Fetal Wellbeing: Cat I Pain Control: none needed at this time; Desires epidural when needed I/D: GBS pos; PCN initiated on admission; PROM at 2200; Afibrile Anticipated MOD: SVD   Kirsten Hoover 02/21/2013, 5:46 AM

## 2013-02-21 NOTE — H&P (Signed)
Sarha A Schupp is a 37 y.o. female, [id number] at [redacted]w[redacted]d, presenting for PROM.  Gush of clear fluid at 2200.  UCs began about 0200, mild and short but growing in intensity and frequency.  Denies VB, recent fever, resp or GI c/o's, UTI or PIH s/s. GFM. Desires epidural.  Patient Active Problem List   Diagnosis Date Noted  . UTI (urinary tract infection) 02/21/2013  . Vaginal irritation 02/21/2013  . Sciatica 02/21/2013  . Advanced maternal age (AMA) in pregnancy 07/29/2012  . Hyperemesis arising during pregnancy 07/16/2012  . Right axillary mass 07/16/2012  . Sickle cell trait 07/01/2012  . SAB (spontaneous abortion) October 2013 03/29/2012    History of present pregnancy: Patient entered care at 10 weeks.   EDC of 02/19/13 was established by LMP.   Anatomy scan:  21 weeks, with normal findings and an posterior placenta.   Additional [place] evaluations:  [redacted]w[redacted]d for S<D - EFW 41st%ile, AFI 55th%ile.   Significant prenatal events:  None   Last evaluation:  02/20/13 at [redacted]w[redacted]d   2 cm / 50% / -2  OB History   Grav Para Term Preterm Abortions TAB SAB Ect Mult Living   5 3 3  1  1   3      History reviewed. No pertinent past medical history. Past Surgical History  Procedure Laterality Date  . No past surgeries     Family History: family history includes Hypertension in her father and mother. There is no history of Other. Social History:  reports that she has never smoked. She has never used smokeless tobacco. She reports that she does not drink alcohol or use illicit drugs.   Prenatal Transfer Tool  Maternal Diabetes: No Genetic Screening: Declined Maternal Ultrasounds/Referrals: Normal Fetal Ultrasounds or other Referrals:  None Maternal Substance Abuse:  No Significant Maternal Medications:  None Significant Maternal Lab Results: Lab values include: Group B Strep positive    ROS: see HPI above, all other systems are negative  No Known Allergies   Dilation: 1 Effacement (%):  50 Station: -3 Exam by:: J. Kaimana Lurz CNM Blood pressure 132/81, pulse 87, temperature 97.9 F (36.6 C), temperature source Oral, resp. rate 18, height 5\' 9"  (1.753 m), weight 240 lb (108.863 kg), last menstrual period 05/15/2012, SpO2 100.00%.  Chest clear Heart RRR without murmur Abd gravid, NT Ext: WNL  FHR: Reactive NST UCs:  Unable to trace  Prenatal labs: ABO, Rh: O/POS/-- (01/09 1522) Antibody: NEG (01/09 1522) Rubella:   Immune RPR: NON REAC (01/09 1522)  HBsAg: NEGATIVE (01/09 1522)  HIV: NON REACTIVE (01/09 1522)  GBS: Positive (08/06 0000) Sickle cell/Hgb electrophoresis:  Positive solubility for Hgb S Pap:  07/29/12 WNL GC:  Neg Chlamydia:  Neg Genetic screenings:  Declined Glucola:  1 hour = 163; 3 hour = 72, 190, 163, 102 Other:  Varicella Immune    Assessment/Plan: IUP at [redacted]w[redacted]d PROM with irregular mild UCs GBS positive Desires epidural  Admit to BS per consult with Dr. Su Hilt as attending MD Routine admission orders GBS prophylaxis per protocol, PCN Allow UCs to get stronger and more frequent for the next 2 hours Discussed using Pitocin if labor does not start - pt is agreeable Epidural prn  Rowan Blase, MSN 02/21/2013, 3:45 AM

## 2013-02-21 NOTE — Progress Notes (Signed)
Patient ID: [name], female   DOB: 09-Oct-1975, 37 y.o.   MRN: [id number] JULISSA BROWNING is a 37 y.o. [id number] at [redacted]w[redacted]d admitted for PROM  Subjective: Remains comfortable w epidural, denies any pain or pressure  Objective: BP 105/56  Pulse 68  Temp(Src) 98.7 F (37.1 C) (Oral)  Resp 18  Ht 5\' 9"  (1.753 m)  Wt 240 lb (108.863 kg)  BMI 35.43 kg/m2  SpO2 99%  LMP 05/15/2012     FHT:  FHR: 130 bpm, variability: moderate,  accelerations:  Present,  decelerations:  Present early variable UC:   regular, every 2-3 minutes SVE:   Dilation: 4 Effacement (%): 70 Station: -1 Exam by:: S. Isabelly Kobler, CNM  IUPC placed easily    Assessment / Plan: Augmentation of labor, progressing well  Labor: minimal cervical change, will place IUPC and titrate pitoicin for adequate MVU's  Preeclampsia:  no s/s Fetal Wellbeing:  Category I Pain Control:  Epidural Anticipated MOD:  NSVD  Prolonged ROM - no s/s chorioamnionitis  Titrate pitocin by 2mu, for adequate MVU's  Update physician PRN   Malissa Hippo 02/21/2013, 9:56 PM

## 2013-02-21 NOTE — Anesthesia Preprocedure Evaluation (Signed)
Anesthesia Evaluation  Patient identified by MRN, date of birth, ID band Patient awake    Reviewed: Allergy & Precautions, H&P , Patient's Chart, lab work & pertinent test results  Airway Mallampati: III TM Distance: >3 FB Neck ROM: Full    Dental no notable dental hx. (+) Teeth Intact   Pulmonary neg pulmonary ROS,  breath sounds clear to auscultation  Pulmonary exam normal       Cardiovascular negative cardio ROS  Rhythm:Regular Rate:Normal     Neuro/Psych negative neurological ROS  negative psych ROS   GI/Hepatic Neg liver ROS, Hyperemesis   Endo/Other  negative endocrine ROS  Renal/GU negative Renal ROS  negative genitourinary   Musculoskeletal negative musculoskeletal ROS (+)   Abdominal (+) + obese,   Peds  Hematology  (+) Blood dyscrasia, Sickle cell trait ,   Anesthesia Other Findings   Reproductive/Obstetrics (+) Pregnancy AMA                           Anesthesia Physical Anesthesia Plan  ASA: II  Anesthesia Plan: Epidural   Post-op Pain Management:    Induction:   Airway Management Planned: Natural Airway  Additional Equipment:   Intra-op Plan:   Post-operative Plan:   Informed Consent: I have reviewed the patients History and Physical, chart, labs and discussed the procedure including the risks, benefits and alternatives for the proposed anesthesia with the patient or authorized representative who has indicated his/her understanding and acceptance.   Dental advisory given  Plan Discussed with: Anesthesiologist and CRNA  Anesthesia Plan Comments:         Anesthesia Quick Evaluation

## 2013-02-21 NOTE — MAU Note (Signed)
Pt reports ROM at 2200, mild contractions

## 2013-02-21 NOTE — MAU Note (Signed)
Pt states she noticed fluid after walking outside and she noticed it after " I entered the home"

## 2013-02-22 ENCOUNTER — Encounter (HOSPITAL_COMMUNITY): Payer: Self-pay | Admitting: *Deleted

## 2013-02-22 LAB — CBC
Hemoglobin: 11.8 g/dL — ABNORMAL LOW (ref 12.0–15.0)
MCH: 28.1 pg (ref 26.0–34.0)
MCHC: 34.2 g/dL (ref 30.0–36.0)
MCV: 82.1 fL (ref 78.0–100.0)

## 2013-02-22 MED ORDER — DIPHENHYDRAMINE HCL 25 MG PO CAPS: 25.0000 mg | ORAL_CAPSULE | Freq: Four times a day (QID) | ORAL | Status: DC | PRN

## 2013-02-22 MED ORDER — SENNOSIDES-DOCUSATE SODIUM 8.6-50 MG PO TABS
2.0000 | ORAL_TABLET | Freq: Every day | ORAL | Status: DC
  Administered 2013-02-22: 2 via ORAL

## 2013-02-22 MED ORDER — ZOLPIDEM TARTRATE 5 MG PO TABS: 5.0000 mg | ORAL_TABLET | Freq: Every evening | ORAL | Status: DC | PRN

## 2013-02-22 MED ORDER — LANOLIN HYDROUS EX OINT: TOPICAL_OINTMENT | CUTANEOUS | Status: DC | PRN

## 2013-02-22 MED ORDER — MEASLES, MUMPS & RUBELLA VAC ~~LOC~~ INJ
0.5000 mL | INJECTION | Freq: Once | SUBCUTANEOUS | Status: DC
  Filled 2013-02-22: qty 0.5

## 2013-02-22 MED ORDER — OXYCODONE-ACETAMINOPHEN 5-325 MG PO TABS
1.0000 | ORAL_TABLET | ORAL | Status: DC | PRN
  Administered 2013-02-22 (×2): 1 via ORAL
  Filled 2013-02-22 (×2): qty 1

## 2013-02-22 MED ORDER — PRENATAL MULTIVITAMIN CH
1.0000 | ORAL_TABLET | Freq: Every day | ORAL | Status: DC
  Administered 2013-02-22 – 2013-02-23 (×2): 1 via ORAL
  Filled 2013-02-22 (×2): qty 1

## 2013-02-22 MED ORDER — MEDROXYPROGESTERONE ACETATE 150 MG/ML IM SUSP: 150.0000 mg | INTRAMUSCULAR | Status: DC | PRN

## 2013-02-22 MED ORDER — FLEET ENEMA 7-19 GM/118ML RE ENEM: 1.0000 | ENEMA | Freq: Every day | RECTAL | Status: DC | PRN

## 2013-02-22 MED ORDER — SIMETHICONE 80 MG PO CHEW: 80.0000 mg | CHEWABLE_TABLET | ORAL | Status: DC | PRN

## 2013-02-22 MED ORDER — WITCH HAZEL-GLYCERIN EX PADS: 1.0000 "application " | MEDICATED_PAD | CUTANEOUS | Status: DC | PRN

## 2013-02-22 MED ORDER — ONDANSETRON HCL 4 MG PO TABS: 4.0000 mg | ORAL_TABLET | ORAL | Status: DC | PRN

## 2013-02-22 MED ORDER — BENZOCAINE-MENTHOL 20-0.5 % EX AERO
1.0000 "application " | INHALATION_SPRAY | CUTANEOUS | Status: DC | PRN
  Administered 2013-02-23: 1 via TOPICAL
  Filled 2013-02-22: qty 56

## 2013-02-22 MED ORDER — BISACODYL 10 MG RE SUPP: 10.0000 mg | Freq: Every day | RECTAL | Status: DC | PRN

## 2013-02-22 MED ORDER — DIBUCAINE 1 % RE OINT: 1.0000 "application " | TOPICAL_OINTMENT | RECTAL | Status: DC | PRN

## 2013-02-22 MED ORDER — ONDANSETRON HCL 4 MG/2ML IJ SOLN: 4.0000 mg | INTRAMUSCULAR | Status: DC | PRN

## 2013-02-22 MED ORDER — TETANUS-DIPHTH-ACELL PERTUSSIS 5-2.5-18.5 LF-MCG/0.5 IM SUSP: 0.5000 mL | Freq: Once | INTRAMUSCULAR | Status: DC

## 2013-02-22 MED ORDER — IBUPROFEN 600 MG PO TABS
600.0000 mg | ORAL_TABLET | Freq: Four times a day (QID) | ORAL | Status: DC
  Administered 2013-02-22 – 2013-02-23 (×6): 600 mg via ORAL
  Filled 2013-02-22 (×6): qty 1

## 2013-02-22 NOTE — Anesthesia Postprocedure Evaluation (Signed)
Anesthesia Post Note  Patient: Kirsten Hoover  Procedure(s) Performed: * No procedures listed *  Anesthesia type: Epidural  Patient location: Mother/Baby  Post pain: Pain level controlled  Post assessment: Post-op Vital signs reviewed  Last Vitals:  Filed Vitals:   02/22/13 0639  BP: 106/72  Pulse: 77  Temp: 36.2 C  Resp: 18    Post vital signs: Reviewed  Level of consciousness:alert  Complications: No apparent anesthesia complications

## 2013-02-22 NOTE — Progress Notes (Addendum)
Post Partum Day 1;S/P SVB, 2nd degree perineal, periurethral laceration.  Subjective: Patient up ad lib, denies syncope or dizziness. Feeding:  Breast Contraceptive plan:   Undecided  Objective: Blood pressure 106/72, pulse 77, temperature 97.2 F (36.2 C), temperature source Oral, resp. rate 18, height 5\' 9"  (1.753 m), weight 240 lb (108.863 kg), last menstrual period 05/15/2012, SpO2 99.00%, unknown if currently breastfeeding.  Physical Exam:  General: alert Lochia: appropriate Uterine Fundus: firm Incision: healing well DVT Evaluation: No evidence of DVT seen on physical exam. Negative Homan's sign.   Recent Labs  02/21/13 0405 02/22/13 0632  HGB 11.9* 11.8*  HCT 33.9* 34.5*    Assessment/Plan: S/P Vaginal delivery day 1 Continue current care Plan for discharge tomorrow Undecided regarding contraception.   LOS: 1 day   Mehmet Scally 02/22/2013, 8:16 AM

## 2013-02-23 DIAGNOSIS — O4210 Premature rupture of membranes, onset of labor more than 24 hours following rupture, unspecified weeks of gestation: Secondary | ICD-10-CM | POA: Diagnosis not present

## 2013-02-23 HISTORY — DX: Premature rupture of membranes, onset of labor more than 24 hours following rupture, unspecified weeks of gestation: O42.10

## 2013-02-23 MED ORDER — IBUPROFEN 600 MG PO TABS: 600.0000 mg | ORAL_TABLET | Freq: Four times a day (QID) | ORAL | Status: DC

## 2013-02-23 MED ORDER — OXYCODONE-ACETAMINOPHEN 5-325 MG PO TABS: 1.0000 | ORAL_TABLET | ORAL | Status: DC | PRN

## 2013-02-23 NOTE — Discharge Summary (Signed)
Vaginal Delivery Discharge Summary  Kirsten Hoover  DOB:    1976-01-07 MRN:    [id number] CSN:    [id number]  Date of admission:                  02/21/13  Date of discharge:                   02/23/13  Procedures this admission: SVD with a 2nd degree perineal lac and periurethral  Date of Delivery: 02/21/13 by Almond Lint  Newborn Data:  Live born female  Birth Weight: 7 lb 11.8 oz (3510 g) APGAR: 9, 9  Home with mother. Circumcision Plan: n/a  History of Present Illness:  Ms. Kirsten Hoover is a 37 y.o. female, [id number], who presents at [redacted]w[redacted]d weeks gestation. The patient has been followed at the [facility] and Gy[facility]cology division of [facility]. She was admitted rupture of membranes. Her pregnancy has been complicated by:   Patient Active Problem List   Diagnosis Date Noted  . Vaginal delivery 02/22/2013  . UTI (urinary tract infection) 02/21/2013  . Vaginal irritation 02/21/2013  . Sciatica 02/21/2013  . Advanced maternal age (AMA) in pregnancy 07/29/2012  . Hyperemesis arising during pregnancy 07/16/2012  . Right axillary mass 07/16/2012  . Sickle cell trait 07/01/2012  . SAB (spontaneous abortion) October 2013 03/29/2012   Hospital course:  The patient was admitted for IOL for PROM.   Her labor was not complicated though ROM for 26 hours. She proceeded to have a vaginal delivery of a healthy infant. Her delivery was not complicated. Her postpartum course was not complicated.  She was discharged to home on postpartum day 2 doing well.  Feeding:  breast  Contraception:  IUD  Discharge hemoglobin:  Hemoglobin  Date Value Range Status  02/22/2013 11.8* 12.0 - 15.0 g/dL Final  09/18/3242 01.0  27.2 - 16.2 g/dL Final     HCT  Date Value Range Status  02/22/2013 34.5* 36.0 - 46.0 % Final     HCT, POC  Date Value Range Status  10/25/2011 37.8  37.7 - 47.9 % Final    Discharge Physical Exam:   General: alert, cooperative and  no distress Lochia: appropriate Uterine Fundus: firm Incision: healing well DVT Evaluation: No evidence of DVT seen on physical exam. Negative Homan's sign.  Intrapartum Procedures: spontaneous vaginal delivery and GBS prophylaxis Postpartum Procedures: none Complications-Operative and Postpartum: 2nd degree perineal laceration  Discharge Diagnoses: Term Pregnancy-delivered and PROM x26 hours  Discharge Information:  Activity:           pelvic rest Diet:                routine Medications: PNV, Ibuprofen and Percocet Condition:      stable Instructions:  refer to practice specific booklet Discharge to: home  Follow-up Information   Follow up with [facility]. Schedule an appointment as soon as possible for a visit in 5 weeks. (Call with anyquestions or concerns.)    Specialty:  Obstetrics and Gynecology   Contact information:   [address] [place] [address] [phone]       Kirsten Hoover 02/23/2013

## 2013-02-23 NOTE — Progress Notes (Signed)
Clinical Social Work Department PSYCHOSOCIAL ASSESSMENT - MATERNAL/CHILD 02/23/2013  Patient:  Kirsten Hoover, Kirsten Hoover  Account Number:  [id number]  Admit Date:  02/21/2013  Marjo Bicker Name:   Aline August    Clinical Social Worker:  Donyale Falcon, LCSW   Date/Time:  02/23/2013 09:30 AM  Date Referred:  02/22/2013   Referral source  Central Nursery     Referred reason  Depression/Anxiety   Other referral source:    I:  FAMILY / HOME ENVIRONMENT Child's legal guardian:  PARENT  Guardian - Name Guardian - Age Guardian - Address  Fatehalrhman Alsaid Abdalrhim  [address]  [place],  [place] [address]  [facility] [address]  [place],  [place] [address]   Other household support members/support persons Other support:    II  PSYCHOSOCIAL DATA Information Source:  Patient Interview  Event organiser Employment:   Surveyor, quantity resources:  Media planner If [employer] - [place]:    School / Grade:   Maternity Care Coordinator / Child Services Coordination / Early Interventions:  Cultural issues impacting care:    III  STRENGTHS  Strength comment:    IV  RISK FACTORS AND CURRENT PROBLEMS Current Problem:  None   Risk Factor & Current Problem Patient Issue Family Issue Risk Factor / Current Problem Comment   N N     V  SOCIAL WORK ASSESSMENT Met with mother who was pleasant and receptive to social work intervention.  She is married and have 3 other dependents ages 45, 62, and 90.  Both parents are employed.   Per chart review, mother has hx of anxiety.  However, she denies any hx of anxiety or depression.  She also denies any family hx of mental illness.   She denies SI and reports no depressive symptoms or anxiety.  She also denies any hx of substance abuse.  Mother reports extensive support from friends.   Her mother lives in [place] and hopes to be able to visit to help her with newborn.    No acute social concerns noted or reported at this time.  Mother informed of  social work Surveyor, mining.   VI SOCIAL WORK PLAN Social Work Plan  No Further Intervention Required / No Barriers to Discharge   Falon Flinchum J, LCSW

## 2013-08-26 ENCOUNTER — Ambulatory Visit (INDEPENDENT_AMBULATORY_CARE_PROVIDER_SITE_OTHER): Payer: BC Managed Care – PPO | Admitting: Emergency Medicine

## 2013-08-26 ENCOUNTER — Ambulatory Visit: Payer: BC Managed Care – PPO

## 2013-08-26 VITALS — BP 130/76 | HR 80 | Temp 98.0°F | Resp 17 | Ht 70.0 in | Wt 238.0 lb

## 2013-08-26 DIAGNOSIS — M722 Plantar fascial fibromatosis: Secondary | ICD-10-CM

## 2013-08-26 MED ORDER — NAPROXEN SODIUM 550 MG PO TABS: 550.0000 mg | ORAL_TABLET | Freq: Two times a day (BID) | ORAL | Status: DC

## 2013-08-26 NOTE — Progress Notes (Signed)
[facility] [address], [place] [id number] [phone]  Date:  08/26/2013   Name:  Kirsten Hoover   DOB:  09/15/1975   MRN:  [id number]  PCP:  Pcp Not In System    Chief Complaint: Foot Pain   History of Present Illness:  Kirsten Hoover is a 38 y.o. very pleasant female patient who presents with the following:  No history of injury.  Has pain in left heal for past three months.  Worse in am when she initially stands and after sitting a long time and standing up.  No improvement with over the counter medications or other home remedies. Denies other complaint or health concern today.   Patient Active Problem List   Diagnosis Date Noted  . Prolonged rupture of membranes, greater than 24 hours, delivered 02/23/2013  . Vaginal delivery 02/22/2013  . UTI (urinary tract infection) 02/21/2013  . Vaginal irritation 02/21/2013  . Sciatica 02/21/2013  . Advanced maternal age (AMA) in pregnancy 07/29/2012  . Hyperemesis arising during pregnancy 07/16/2012  . Right axillary mass 07/16/2012  . Sickle cell trait 07/01/2012  . SAB (spontaneous abortion) October 2013 03/29/2012    Past Medical History  Diagnosis Date  . AMA (advanced maternal age) multigravida 35+   . Sciatica   . Sickle cell trait     Past Surgical History  Procedure Laterality Date  . No past surgeries      History  Substance Use Topics  . Smoking status: Never Smoker   . Smokeless tobacco: Never Used  . Alcohol Use: No    Family History  Problem Relation Age of Onset  . Other Neg Hx   . Hypertension Mother   . Hypertension Father     No Known Allergies  Medication list has been reviewed and updated.  Current Outpatient Prescriptions on File Prior to Visit  Medication Sig Dispense Refill  . ibuprofen (ADVIL,MOTRIN) 600 MG tablet Take 1 tablet (600 mg total) by mouth every 6 (six) hours.  30 tablet  0  . Prenatal Vit-Fe Fumarate-FA (PRENATAL MULTIVITAMIN) TABS Take 1  tablet by mouth daily.  30 tablet  11   No current facility-administered medications on file prior to visit.    Review of Systems:  As per HPI, otherwise negative.    Physical Examination: Filed Vitals:   08/26/13 1737  BP: 130/76  Pulse: 80  Temp: 98 F (36.7 C)  Resp: 17   Filed Vitals:   08/26/13 1737  Height: 5\' 10"  (1.778 m)  Weight: 238 lb (107.956 kg)   Body mass index is 34.15 kg/(m^2). Ideal Body Weight: Weight in (lb) to have BMI = 25: 173.9   GEN: WDWN, NAD, Non-toxic, Alert & Oriented x 3 HEENT: Atraumatic, Normocephalic.  Ears and Nose: No external deformity. EXTR: No clubbing/cyanosis/edema NEURO: Normal gait.  PSYCH: Normally interactive. Conversant. Not depressed or anxious appearing.  Calm demeanor.  Left foot:  Tender anterior to heel.  Pain worse with dorsiflexion and palpation.  No cellulitis or ecchymosis.  Assessment and Plan: Plantar fasciitis Anaprox ds [facility] if no improvement   Signed,  Phillips OdorJeffery Anderson, MD  [facility] reading (PRIMARY) by  Dr. Dareen PianoAnderson.  Negative but for small spur.

## 2013-08-26 NOTE — Patient Instructions (Signed)

## 2014-01-16 ENCOUNTER — Ambulatory Visit (INDEPENDENT_AMBULATORY_CARE_PROVIDER_SITE_OTHER): Payer: BC Managed Care – PPO | Admitting: Physician Assistant

## 2014-01-16 VITALS — BP 122/80 | HR 93 | Temp 98.7°F | Resp 16 | Ht 70.75 in | Wt 221.4 lb

## 2014-01-16 DIAGNOSIS — R110 Nausea: Secondary | ICD-10-CM

## 2014-01-16 DIAGNOSIS — R300 Dysuria: Secondary | ICD-10-CM

## 2014-01-16 DIAGNOSIS — M722 Plantar fascial fibromatosis: Secondary | ICD-10-CM

## 2014-01-16 DIAGNOSIS — R109 Unspecified abdominal pain: Secondary | ICD-10-CM

## 2014-01-16 DIAGNOSIS — R350 Frequency of micturition: Secondary | ICD-10-CM

## 2014-01-16 DIAGNOSIS — N390 Urinary tract infection, site not specified: Secondary | ICD-10-CM

## 2014-01-16 LAB — POCT CBC
Granulocyte percent: 73.7 %G (ref 37–80)
HCT, POC: 38.9 % (ref 37.7–47.9)
Hemoglobin: 12.8 g/dL (ref 12.2–16.2)
Lymph, poc: 1.8 (ref 0.6–3.4)
MCH, POC: 27 pg (ref 27–31.2)
MCHC: 32.9 g/dL (ref 31.8–35.4)
MCV: 82.2 fL (ref 80–97)
MID (cbc): 0.6 (ref 0–0.9)
MPV: 8.4 fL (ref 0–99.8)
POC Granulocyte: 6.9 (ref 2–6.9)
POC LYMPH PERCENT: 19.7 %L (ref 10–50)
POC MID %: 6.6 %M (ref 0–12)
Platelet Count, POC: 305 10*3/uL (ref 142–424)
RBC: 4.73 M/uL (ref 4.04–5.48)
RDW, POC: 12.8 %
WBC: 9.3 10*3/uL (ref 4.6–10.2)

## 2014-01-16 LAB — POCT UA - MICROSCOPIC ONLY
Casts, Ur, LPF, POC: NEGATIVE
Crystals, Ur, HPF, POC: NEGATIVE
Mucus, UA: POSITIVE
Yeast, UA: NEGATIVE

## 2014-01-16 LAB — POCT URINALYSIS DIPSTICK
Bilirubin, UA: NEGATIVE
Glucose, UA: NEGATIVE
Ketones, UA: NEGATIVE
Nitrite, UA: POSITIVE
Protein, UA: 100
Spec Grav, UA: 1.01
Urobilinogen, UA: 0.2
pH, UA: 6.5

## 2014-01-16 LAB — POCT URINE PREGNANCY: Preg Test, Ur: NEGATIVE

## 2014-01-16 MED ORDER — ONDANSETRON 4 MG PO TBDP: 4.0000 mg | ORAL_TABLET | Freq: Three times a day (TID) | ORAL | Status: DC | PRN

## 2014-01-16 MED ORDER — PHENAZOPYRIDINE HCL 200 MG PO TABS: 200.0000 mg | ORAL_TABLET | Freq: Three times a day (TID) | ORAL | Status: DC | PRN

## 2014-01-16 MED ORDER — CIPROFLOXACIN HCL 500 MG PO TABS: 500.0000 mg | ORAL_TABLET | Freq: Two times a day (BID) | ORAL | Status: DC

## 2014-01-16 MED ORDER — ONDANSETRON 4 MG PO TBDP
8.0000 mg | ORAL_TABLET | Freq: Once | ORAL | Status: AC
  Administered 2014-01-16: 8 mg via ORAL

## 2014-01-16 MED ORDER — CEFTRIAXONE SODIUM 1 G IJ SOLR
1.0000 g | Freq: Once | INTRAMUSCULAR | Status: AC
  Administered 2014-01-16: 1 g via INTRAMUSCULAR

## 2014-01-16 NOTE — Patient Instructions (Signed)

## 2014-01-16 NOTE — Progress Notes (Signed)
Subjective:    Patient ID: Kirsten Hoover, female    DOB: 03/17/1976, 38 y.o.   MRN: [id number]  HPI 38 year old female presents for evaluation of 3 day history of lower abdominal cramping/pressure, dysuria, urinary frequency, flank pain, and nausea with vomiting.  She has taken ibuprofen and tylenol that haven't helped much with her discomfort.  Denies fever, chills, headache, dizziness, diarrhea, or constipation. No hx of UTI's or kidney stones in the past.  Symptoms have been progressively worsening.   Patient also c/o persistent pain in her feet. Symptoms are similar to last OV in 08/2013. States anaprox did not help. She does occasionally do stretches which does improve her pain somewhat. She is interested in a referral for further evaluation and tx.     Review of Systems  Constitutional: Negative for fever and chills.  Gastrointestinal: Positive for nausea, vomiting and abdominal pain.  Genitourinary: Positive for dysuria, frequency and flank pain. Negative for vaginal discharge, menstrual problem and pelvic pain.  Neurological: Negative for dizziness and headaches.       Objective:   Physical Exam  Constitutional: She is oriented to person, place, and time. She appears well-developed and well-nourished.  HENT:  Head: Normocephalic and atraumatic.  Right Ear: External ear normal.  Left Ear: External ear normal.  Eyes: Conjunctivae are normal.  Neck: Normal range of motion.  Cardiovascular: Normal rate, regular rhythm and normal heart sounds.   Pulmonary/Chest: Effort normal and breath sounds normal.  Abdominal: Soft. Bowel sounds are normal. There is tenderness in the right lower quadrant, suprapubic area and left lower quadrant. There is CVA tenderness (mild, bilateral). There is no rigidity, no rebound and no guarding.  Neurological: She is alert and oriented to person, place, and time.  Psychiatric: She has a normal mood and affect. Her behavior is normal. Judgment and  thought content normal.    Results for orders placed in visit on 01/16/14  POCT URINALYSIS DIPSTICK      Result Value Ref Range   Color, UA yellow     Clarity, UA cloudy     Glucose, UA neg     Bilirubin, UA neg     Ketones, UA neg     Spec Grav, UA 1.010     Blood, UA large     pH, UA 6.5     Protein, UA 100     Urobilinogen, UA 0.2     Nitrite, UA positive     Leukocytes, UA small (1+)    POCT UA - MICROSCOPIC ONLY      Result Value Ref Range   WBC, Ur, HPF, POC TNTC     RBC, urine, microscopic TNTC     Bacteria, U Microscopic 4+     Mucus, UA positive     Epithelial cells, urine per micros 5-7     Crystals, Ur, HPF, POC neg     Casts, Ur, LPF, POC neg     Yeast, UA neg    POCT CBC      Result Value Ref Range   WBC 9.3  4.6 - 10.2 K/uL   Lymph, poc 1.8  0.6 - 3.4   POC LYMPH PERCENT 19.7  10 - 50 %L   MID (cbc) 0.6  0 - 0.9   POC MID % 6.6  0 - 12 %M   POC Granulocyte 6.9  2 - 6.9   Granulocyte percent 73.7  37 - 80 %G   RBC 4.73  4.04 -  5.48 M/uL   Hemoglobin 12.8  12.2 - 16.2 g/dL   HCT, POC 16.138.9  09.637.7 - 47.9 %   MCV 82.2  80 - 97 fL   MCH, POC 27.0  27 - 31.2 pg   MCHC 32.9  31.8 - 35.4 g/dL   RDW, POC 04.512.8     Platelet Count, POC 305  142 - 424 K/uL   MPV 8.4  0 - 99.8 fL  POCT URINE PREGNANCY      Result Value Ref Range   Preg Test, Ur Negative           Assessment & Plan:  Urinary tract infection, site not specified - Plan: ciprofloxacin (CIPRO) 500 MG tablet  Dysuria - Plan: POCT urinalysis dipstick, POCT UA - Microscopic Only, POCT CBC, Urine culture, phenazopyridine (PYRIDIUM) 200 MG tablet  Urinary frequency - Plan: Urine culture, phenazopyridine (PYRIDIUM) 200 MG tablet  Flank pain - Plan: POCT CBC  Nausea alone - Plan: POCT urine pregnancy, ondansetron (ZOFRAN ODT) 4 MG disintegrating tablet  Plantar fasciitis - Plan: Ambulatory referral to Orthopedic Surgery  Abdominal pain, unspecified site - Plan: POCT urine pregnancy  Will treat  as early pyelonephritis - Rocephin 1 gram IM today. Start Cipro 500 mg bid x 7 days Push fluids. Note out of work tomorrow if needed.  Pyridium 200 mg tid prn pain. Ok to continue tylenol or motrin if needed.   Referral to Ortho for further evaluation and a management of plantar fasciitis.

## 2014-01-19 LAB — URINE CULTURE: Colony Count: >=100000

## 2014-02-23 ENCOUNTER — Encounter: Payer: Self-pay | Admitting: Physician Assistant

## 2014-02-23 DIAGNOSIS — M722 Plantar fascial fibromatosis: Secondary | ICD-10-CM | POA: Insufficient documentation

## 2014-04-20 ENCOUNTER — Encounter: Payer: Self-pay | Admitting: Physician Assistant

## 2014-05-15 ENCOUNTER — Ambulatory Visit (INDEPENDENT_AMBULATORY_CARE_PROVIDER_SITE_OTHER): Payer: BC Managed Care – PPO | Admitting: Family Medicine

## 2014-05-15 VITALS — BP 122/82 | HR 74 | Temp 98.1°F | Resp 16 | Ht 70.0 in | Wt 219.0 lb

## 2014-05-15 DIAGNOSIS — J069 Acute upper respiratory infection, unspecified: Secondary | ICD-10-CM

## 2014-05-15 DIAGNOSIS — R0982 Postnasal drip: Secondary | ICD-10-CM

## 2014-05-15 DIAGNOSIS — R110 Nausea: Secondary | ICD-10-CM

## 2014-05-15 MED ORDER — ONDANSETRON 4 MG PO TBDP: 4.0000 mg | ORAL_TABLET | Freq: Three times a day (TID) | ORAL | Status: DC | PRN

## 2014-05-15 NOTE — Patient Instructions (Signed)
Saline nasal spray atleast 4 times per day for congestion.  Tr to spit out congestion/drainage and not swallow it as it can cause nausea. over the counter mucinex or mucinex DM if needed for coughing. Zofran only if needed for nausea. drink plenty of fluids.  Return to the clinic or go to the nearest emergency room if any of your symptoms worsen or new symptoms occur.  Upper Respiratory Infection, Adult An upper respiratory infection (URI) is also sometimes known as the common cold. The upper respiratory tract includes the nose, sinuses, throat, trachea, and bronchi. Bronchi are the airways leading to the lungs. Most people improve within 1 week, but symptoms can last up to 2 weeks. A residual cough may last even longer.  CAUSES Many different viruses can infect the tissues lining the upper respiratory tract. The tissues become irritated and inflamed and often become very moist. Mucus production is also common. A cold is contagious. You can easily spread the virus to others by oral contact. This includes kissing, sharing a glass, coughing, or sneezing. Touching your mouth or nose and then touching a surface, which is then touched by another person, can also spread the virus. SYMPTOMS  Symptoms typically develop 1 to 3 days after you come in contact with a cold virus. Symptoms vary from person to person. They may include:  Runny nose.  Sneezing.  Nasal congestion.  Sinus irritation.  Sore throat.  Loss of voice (laryngitis).  Cough.  Fatigue.  Muscle aches.  Loss of appetite.  Headache.  Low-grade fever. DIAGNOSIS  You might diagnose your own cold based on familiar symptoms, since most people get a cold 2 to 3 times a year. Your caregiver can confirm this based on your exam. Most importantly, your caregiver can check that your symptoms are not due to another disease such as strep throat, sinusitis, pneumonia, asthma, or epiglottitis. Blood tests, throat tests, and X-rays are not  necessary to diagnose a common cold, but they may sometimes be helpful in excluding other more serious diseases. Your caregiver will decide if any further tests are required. RISKS AND COMPLICATIONS  You may be at risk for a more severe case of the common cold if you smoke cigarettes, have chronic heart disease (such as heart failure) or lung disease (such as asthma), or if you have a weakened immune system. The very young and very old are also at risk for more serious infections. Bacterial sinusitis, middle ear infections, and bacterial pneumonia can complicate the common cold. The common cold can worsen asthma and chronic obstructive pulmonary disease (COPD). Sometimes, these complications can require emergency medical care and may be life-threatening. PREVENTION  The best way to protect against getting a cold is to practice good hygiene. Avoid oral or hand contact with people with cold symptoms. Wash your hands often if contact occurs. There is no clear evidence that vitamin C, vitamin E, echinacea, or exercise reduces the chance of developing a cold. However, it is always recommended to get plenty of rest and practice good nutrition. TREATMENT  Treatment is directed at relieving symptoms. There is no cure. Antibiotics are not effective, because the infection is caused by a virus, not by bacteria. Treatment may include:  Increased fluid intake. Sports drinks offer valuable electrolytes, sugars, and fluids.  Breathing heated mist or steam (vaporizer or shower).  Eating chicken soup or other clear broths, and maintaining good nutrition.  Getting plenty of rest.  Using gargles or lozenges for comfort.  Controlling fevers with  ibuprofen or acetaminophen as directed by your caregiver.  Increasing usage of your inhaler if you have asthma. Zinc gel and zinc lozenges, taken in the first 24 hours of the common cold, can shorten the duration and lessen the severity of symptoms. Pain medicines may help  with fever, muscle aches, and throat pain. A variety of non-prescription medicines are available to treat congestion and runny nose. Your caregiver can make recommendations and may suggest nasal or lung inhalers for other symptoms.  HOME CARE INSTRUCTIONS   Only take over-the-counter or prescription medicines for pain, discomfort, or fever as directed by your caregiver.  Use a warm mist humidifier or inhale steam from a shower to increase air moisture. This may keep secretions moist and make it easier to breathe.  Drink enough water and fluids to keep your urine clear or pale yellow.  Rest as needed.  Return to work when your temperature has returned to normal or as your caregiver advises. You may need to stay home longer to avoid infecting others. You can also use a face mask and careful hand washing to prevent spread of the virus. SEEK MEDICAL CARE IF:   After the first few days, you feel you are getting worse rather than better.  You need your caregiver's advice about medicines to control symptoms.  You develop chills, worsening shortness of breath, or brown or red sputum. These may be signs of pneumonia.  You develop yellow or brown nasal discharge or pain in the face, especially when you bend forward. These may be signs of sinusitis.  You develop a fever, swollen neck glands, pain with swallowing, or white areas in the back of your throat. These may be signs of strep throat. SEEK IMMEDIATE MEDICAL CARE IF:   You have a fever.  You develop severe or persistent headache, ear pain, sinus pain, or chest pain.  You develop wheezing, a prolonged cough, cough up blood, or have a change in your usual mucus (if you have chronic lung disease).  You develop sore muscles or a stiff neck. Document Released: 11/29/2000 Document Revised: 08/28/2011 Document Reviewed: 09/10/2013 [facility] Patient Information 2015 [employer], [place]. This information is not intended to replace advice given to you by  your health care provider. Make sure you discuss any questions you have with your health care provider.  Nausea, Adult Nausea is the feeling that you have an upset stomach or have to vomit. Nausea by itself is not likely a serious concern, but it may be an early sign of more serious medical problems. As nausea gets worse, it can lead to vomiting. If vomiting develops, there is the risk of dehydration.  CAUSES   Viral infections.  Food poisoning.  Medicines.  Pregnancy.  Motion sickness.  Migraine headaches.  Emotional distress.  Severe pain from any source.  Alcohol intoxication. HOME CARE INSTRUCTIONS  Get plenty of rest.  Ask your caregiver about specific rehydration instructions.  Eat small amounts of food and sip liquids more often.  Take all medicines as told by your caregiver. SEEK MEDICAL CARE IF:  You have not improved after 2 days, or you get worse.  You have a headache. SEEK IMMEDIATE MEDICAL CARE IF:   You have a fever.  You faint.  You keep vomiting or have blood in your vomit.  You are extremely weak or dehydrated.  You have dark or bloody stools.  You have severe chest or abdominal pain. MAKE SURE YOU:  Understand these instructions.  Will watch your condition.  Will get help right away if you are not doing well or get worse. Document Released: 07/13/2004 Document Revised: 02/28/2012 Document Reviewed: 02/15/2011 [facility] Patient Information 2015 [employer], [place]. This information is not intended to replace advice given to you by your health care provider. Make sure you discuss any questions you have with your health care provider.

## 2014-05-15 NOTE — Progress Notes (Signed)
Subjective:    Patient ID: Kirsten Hoover, female    DOB: [birth year], 38 y.o.   MRN: [id number] This chart was scribed for Dr. Meredith Staggers by Chestine Spore, ED Scribe. The patient was seen in room 10 at 1:13 PM.   Chief Complaint  Patient presents with  . feels mucus in throat    HPI Kirsten Hoover is a 38 y.o. female who presents today complaining of mucous in back of throat onset 3 days ago. She reports that she is having associated symptoms of nausea, vomiting x 1, congestion. She states that she has tried no medications for relief for her symptoms. She denies fevers, cough, sore throat, abdominal pain, and any other symptoms. Her 25 month old infant has a cold currently. Her lmp was  last week and it was normal. She denies being pregnant.   Patient Active Problem List   Diagnosis Date Noted  . Plantar fasciitis, bilateral 02/23/2014  . Sciatica 02/21/2013  . Right axillary mass 07/16/2012  . Sickle cell trait 07/01/2012   Past Medical History  Diagnosis Date  . AMA (advanced maternal age) multigravida 35+   . Sciatica   . Sickle cell trait   . Prolonged rupture of membranes, greater than 24 hours, delivered 02/23/2013    26 hours   . Vaginal delivery 02/22/2013  . Advanced maternal age (AMA) in pregnancy 07/29/2012  . Hyperemesis arising during pregnancy 07/16/2012  . SAB (spontaneous abortion) October 2013 03/29/2012  . UTI (urinary tract infection) 02/21/2013  . Vaginal irritation 02/21/2013   Past Surgical History  Procedure Laterality Date  . No past surgeries     No Known Allergies Prior to Admission medications   Medication Sig Start Date End Date Taking? Authorizing Provider  ibuprofen (ADVIL,MOTRIN) 600 MG tablet Take 1 tablet (600 mg total) by mouth every 6 (six) hours. Patient not taking: Reported on 05/15/2014 02/23/13   Haroldine Laws, CNM  naproxen sodium (ANAPROX DS) 550 MG tablet Take 1 tablet (550 mg total) by mouth 2 (two) times daily with a meal. Patient  not taking: Reported on 05/15/2014 08/26/13 08/26/14  Carmelina Dane, MD  ondansetron (ZOFRAN ODT) 4 MG disintegrating tablet Take 1-2 tablets (4-8 mg total) by mouth every 8 (eight) hours as needed for nausea or vomiting. Patient not taking: Reported on 05/15/2014 01/16/14   Nelva Nay, PA-C  phenazopyridine (PYRIDIUM) 200 MG tablet Take 1 tablet (200 mg total) by mouth 3 (three) times daily as needed for pain. Patient not taking: Reported on 05/15/2014 01/16/14   Nelva Nay, PA-C  Prenatal Vit-Fe Fumarate-FA (PRENATAL MULTIVITAMIN) TABS Take 1 tablet by mouth daily. Patient not taking: Reported on 05/15/2014 07/29/12   Kirkland Hun, MD   History   Social History  . Marital Status: Married    Spouse Name: Fatehalrhman Abdalrhim    Number of Children: 3  . Years of Education: 15   Occupational History  . INSPECTION    Social History Main Topics  . Smoking status: Never Smoker   . Smokeless tobacco: Never Used  . Alcohol Use: No  . Drug Use: No  . Sexual Activity: Yes    Birth Control/ Protection: None   Other Topics Concern  . Not on file   Social History Narrative       Review of Systems  Constitutional: Negative for fever.  HENT: Positive for congestion. Negative for sore throat.   Respiratory: Negative for cough.   Gastrointestinal: Positive for nausea and  vomiting. Negative for abdominal pain.       Objective:   Physical Exam  Constitutional: She is oriented to person, place, and time. She appears well-developed and well-nourished. No distress.  HENT:  Head: Normocephalic and atraumatic.  Right Ear: Hearing, tympanic membrane, external ear and ear canal normal.  Left Ear: Hearing, tympanic membrane, external ear and ear canal normal.  Nose: Nose normal.  Mouth/Throat: Oropharynx is clear and moist. No oropharyngeal exudate or posterior oropharyngeal erythema.  Sinuses were non-tender.   Eyes: Conjunctivae and EOM are normal. Pupils are equal,  round, and reactive to light.  Cardiovascular: Normal rate, regular rhythm, normal heart sounds and intact distal pulses.   No murmur heard. Pulmonary/Chest: Effort normal and breath sounds normal. No respiratory distress. She has no wheezes. She has no rhonchi.  Abdominal: Soft. There is no tenderness.  Lymphadenopathy:    She has no cervical adenopathy.  Neurological: She is alert and oriented to person, place, and time.  Skin: Skin is warm and dry. No rash noted.  Psychiatric: She has a normal mood and affect. Her behavior is normal.  Nursing note and vitals reviewed.       Filed Vitals:   05/15/14 1241  BP: 122/82  Pulse: 74  Temp: 98.1 F (36.7 C)  TempSrc: Oral  Resp: 16  Height: 5\' 10"  (1.778 m)  Weight: 219 lb (99.338 kg)  SpO2: 100%    Assessment & Plan:  COORDINATION OF CARE: 1:19 PM-Discussed treatment plan which includes nasal saline, Zofran, and Mucinex with pt at bedside and pt agreed to plan.   Kirsten Hoover is a 38 y.o. female Nausea without vomiting - Plan: ondansetron (ZOFRAN ODT) 4 MG disintegrating tablet  Acute upper respiratory infection  PND (post-nasal drip) Suspected URI with PND causing nausea. Reassuring exam. Sx care discussed, and if needed for nausea - Zofran Rx. See below. rtc precautions discussed.   Meds ordered this encounter  Medications  . ondansetron (ZOFRAN ODT) 4 MG disintegrating tablet    Sig: Take 1 tablet (4 mg total) by mouth every 8 (eight) hours as needed for nausea or vomiting.    Dispense:  10 tablet    Refill:  0   Patient Instructions  Saline nasal spray atleast 4 times per day for congestion.  Tr to spit out congestion/drainage and not swallow it as it can cause nausea. over the counter mucinex or mucinex DM if needed for coughing. Zofran only if needed for nausea. drink plenty of fluids.  Return to the clinic or go to the nearest emergency room if any of your symptoms worsen or new symptoms occur.  Upper  Respiratory Infection, Adult An upper respiratory infection (URI) is also sometimes known as the common cold. The upper respiratory tract includes the nose, sinuses, throat, trachea, and bronchi. Bronchi are the airways leading to the lungs. Most people improve within 1 week, but symptoms can last up to 2 weeks. A residual cough may last even longer.  CAUSES Many different viruses can infect the tissues lining the upper respiratory tract. The tissues become irritated and inflamed and often become very moist. Mucus production is also common. A cold is contagious. You can easily spread the virus to others by oral contact. This includes kissing, sharing a glass, coughing, or sneezing. Touching your mouth or nose and then touching a surface, which is then touched by another person, can also spread the virus. SYMPTOMS  Symptoms typically develop 1 to 3 days after you come  in contact with a cold virus. Symptoms vary from person to person. They may include:  Runny nose.  Sneezing.  Nasal congestion.  Sinus irritation.  Sore throat.  Loss of voice (laryngitis).  Cough.  Fatigue.  Muscle aches.  Loss of appetite.  Headache.  Low-grade fever. DIAGNOSIS  You might diagnose your own cold based on familiar symptoms, since most people get a cold 2 to 3 times a year. Your caregiver can confirm this based on your exam. Most importantly, your caregiver can check that your symptoms are not due to another disease such as strep throat, sinusitis, pneumonia, asthma, or epiglottitis. Blood tests, throat tests, and X-rays are not necessary to diagnose a common cold, but they may sometimes be helpful in excluding other more serious diseases. Your caregiver will decide if any further tests are required. RISKS AND COMPLICATIONS  You may be at risk for a more severe case of the common cold if you smoke cigarettes, have chronic heart disease (such as heart failure) or lung disease (such as asthma), or if you  have a weakened immune system. The very young and very old are also at risk for more serious infections. Bacterial sinusitis, middle ear infections, and bacterial pneumonia can complicate the common cold. The common cold can worsen asthma and chronic obstructive pulmonary disease (COPD). Sometimes, these complications can require emergency medical care and may be life-threatening. PREVENTION  The best way to protect against getting a cold is to practice good hygiene. Avoid oral or hand contact with people with cold symptoms. Wash your hands often if contact occurs. There is no clear evidence that vitamin C, vitamin E, echinacea, or exercise reduces the chance of developing a cold. However, it is always recommended to get plenty of rest and practice good nutrition. TREATMENT  Treatment is directed at relieving symptoms. There is no cure. Antibiotics are not effective, because the infection is caused by a virus, not by bacteria. Treatment may include:  Increased fluid intake. Sports drinks offer valuable electrolytes, sugars, and fluids.  Breathing heated mist or steam (vaporizer or shower).  Eating chicken soup or other clear broths, and maintaining good nutrition.  Getting plenty of rest.  Using gargles or lozenges for comfort.  Controlling fevers with ibuprofen or acetaminophen as directed by your caregiver.  Increasing usage of your inhaler if you have asthma. Zinc gel and zinc lozenges, taken in the first 24 hours of the common cold, can shorten the duration and lessen the severity of symptoms. Pain medicines may help with fever, muscle aches, and throat pain. A variety of non-prescription medicines are available to treat congestion and runny nose. Your caregiver can make recommendations and may suggest nasal or lung inhalers for other symptoms.  HOME CARE INSTRUCTIONS   Only take over-the-counter or prescription medicines for pain, discomfort, or fever as directed by your caregiver.  Use  a warm mist humidifier or inhale steam from a shower to increase air moisture. This may keep secretions moist and make it easier to breathe.  Drink enough water and fluids to keep your urine clear or pale yellow.  Rest as needed.  Return to work when your temperature has returned to normal or as your caregiver advises. You may need to stay home longer to avoid infecting others. You can also use a face mask and careful hand washing to prevent spread of the virus. SEEK MEDICAL CARE IF:   After the first few days, you feel you are getting worse rather than better.  You need your caregiver's advice about medicines to control symptoms.  You develop chills, worsening shortness of breath, or brown or red sputum. These may be signs of pneumonia.  You develop yellow or brown nasal discharge or pain in the face, especially when you bend forward. These may be signs of sinusitis.  You develop a fever, swollen neck glands, pain with swallowing, or white areas in the back of your throat. These may be signs of strep throat. SEEK IMMEDIATE MEDICAL CARE IF:   You have a fever.  You develop severe or persistent headache, ear pain, sinus pain, or chest pain.  You develop wheezing, a prolonged cough, cough up blood, or have a change in your usual mucus (if you have chronic lung disease).  You develop sore muscles or a stiff neck. Document Released: 11/29/2000 Document Revised: 08/28/2011 Document Reviewed: 09/10/2013 [facility] Patient Information 2015 [place], [place]. This information is not intended to replace advice given to you by your health care provider. Make sure you discuss any questions you have with your health care provider.  Nausea, Adult Nausea is the feeling that you have an upset stomach or have to vomit. Nausea by itself is not likely a serious concern, but it may be an early sign of more serious medical problems. As nausea gets worse, it can lead to vomiting. If vomiting develops, there is  the risk of dehydration.  CAUSES   Viral infections.  Food poisoning.  Medicines.  Pregnancy.  Motion sickness.  Migraine headaches.  Emotional distress.  Severe pain from any source.  Alcohol intoxication. HOME CARE INSTRUCTIONS  Get plenty of rest.  Ask your caregiver about specific rehydration instructions.  Eat small amounts of food and sip liquids more often.  Take all medicines as told by your caregiver. SEEK MEDICAL CARE IF:  You have not improved after 2 days, or you get worse.  You have a headache. SEEK IMMEDIATE MEDICAL CARE IF:   You have a fever.  You faint.  You keep vomiting or have blood in your vomit.  You are extremely weak or dehydrated.  You have dark or bloody stools.  You have severe chest or abdominal pain. MAKE SURE YOU:  Understand these instructions.  Will watch your condition.  Will get help right away if you are not doing well or get worse. Document Released: 07/13/2004 Document Revised: 02/28/2012 Document Reviewed: 02/15/2011 [facility] Patient Information 2015 [place], [place]. This information is not intended to replace advice given to you by your health care provider. Make sure you discuss any questions you have with your health care provider.     I personally performed the services described in this documentation, which was scribed in my presence. The recorded information has been reviewed and considered, and addended by me as needed.

## 2014-05-17 ENCOUNTER — Telehealth: Payer: Self-pay

## 2014-05-17 NOTE — Telephone Encounter (Signed)
Refill of apo-omeprazole

## 2014-05-18 NOTE — Telephone Encounter (Signed)
Spoke to pt- she needs a refill on pantoprazole. Pt advised to RTC- last OV she was not taking this medication. It has not been prescribed since 06/2012.

## 2014-11-30 ENCOUNTER — Ambulatory Visit (INDEPENDENT_AMBULATORY_CARE_PROVIDER_SITE_OTHER): Payer: BLUE CROSS/BLUE SHIELD | Admitting: Family Medicine

## 2014-11-30 VITALS — BP 104/70 | HR 73 | Temp 98.0°F | Resp 18 | Ht 70.0 in | Wt 216.2 lb

## 2014-11-30 DIAGNOSIS — D571 Sickle-cell disease without crisis: Secondary | ICD-10-CM

## 2014-11-30 DIAGNOSIS — K219 Gastro-esophageal reflux disease without esophagitis: Secondary | ICD-10-CM

## 2014-11-30 DIAGNOSIS — Z524 Kidney donor: Secondary | ICD-10-CM

## 2014-11-30 LAB — POCT CBC
Granulocyte percent: 45.7 %G (ref 37–80)
HCT, POC: 40 % (ref 37.7–47.9)
Hemoglobin: 12.7 g/dL (ref 12.2–16.2)
Lymph, poc: 2.8 (ref 0.6–3.4)
MCH, POC: 26 pg — AB (ref 27–31.2)
MCHC: 31.8 g/dL (ref 31.8–35.4)
MCV: 81.9 fL (ref 80–97)
MID (cbc): 0.3 (ref 0–0.9)
MPV: 8.7 fL (ref 0–99.8)
POC Granulocyte: 2.6 (ref 2–6.9)
POC LYMPH PERCENT: 49.7 %L (ref 10–50)
POC MID %: 4.6 %M (ref 0–12)
Platelet Count, POC: 332 10*3/uL (ref 142–424)
RBC: 4.89 M/uL (ref 4.04–5.48)
RDW, POC: 14.6 %
WBC: 5.6 10*3/uL (ref 4.6–10.2)

## 2014-11-30 LAB — POCT UA - MICROSCOPIC ONLY
Bacteria, U Microscopic: NEGATIVE
Casts, Ur, LPF, POC: NEGATIVE
Crystals, Ur, HPF, POC: NEGATIVE
Mucus, UA: POSITIVE
RBC, urine, microscopic: NEGATIVE
Yeast, UA: NEGATIVE

## 2014-11-30 LAB — POCT URINALYSIS DIPSTICK
Bilirubin, UA: NEGATIVE
Blood, UA: NEGATIVE
Glucose, UA: NEGATIVE
Ketones, UA: NEGATIVE
Leukocytes, UA: NEGATIVE
Nitrite, UA: NEGATIVE
Protein, UA: NEGATIVE
Spec Grav, UA: 1.015
Urobilinogen, UA: 0.2
pH, UA: 5.5

## 2014-11-30 LAB — COMPLETE METABOLIC PANEL WITH GFR
ALT: 11 U/L (ref 0–35)
AST: 11 U/L (ref 0–37)
Albumin: 4.4 g/dL (ref 3.5–5.2)
Alkaline Phosphatase: 50 U/L (ref 39–117)
BUN: 8 mg/dL (ref 6–23)
CO2: 26 mEq/L (ref 19–32)
Calcium: 9.8 mg/dL (ref 8.4–10.5)
Chloride: 104 mEq/L (ref 96–112)
Creat: 0.59 mg/dL (ref 0.50–1.10)
GFR, Est African American: >89 mL/min
GFR, Est Non African American: >89 mL/min
Glucose, Bld: 111 mg/dL — ABNORMAL HIGH (ref 70–99)
Potassium: 4.3 mEq/L (ref 3.5–5.3)
Sodium: 138 mEq/L (ref 135–145)
Total Bilirubin: 0.3 mg/dL (ref 0.2–1.2)
Total Protein: 7.5 g/dL (ref 6.0–8.3)

## 2014-11-30 MED ORDER — PANTOPRAZOLE SODIUM 40 MG PO TBEC: 40.0000 mg | DELAYED_RELEASE_TABLET | Freq: Every day | ORAL | Status: DC

## 2014-11-30 NOTE — Progress Notes (Signed)
This is a 39 year old woman from [place] who works [employer]. She recently heard that her brother is in stage V kidney failure and needs a transplant. Patient would like to be screened for the ability of her getting a kidney to him. She does not have a site where this could be done yet and she is reticent about it being done in [place].  Patient also needs refill on her GERD medicine, pantoprazole. She takes his medicine when necessary and has not had serious esophagitis or chest pain recently.  Patient says that she has sickle cell disease although she has never had crisis, oxygen, no transfusion.  Objective: No acute distress BP 104/70 mmHg  Pulse 73  Temp(Src) 98 F (36.7 C) (Oral)  Resp 18  Ht 5\' 10"  (1.778 m)  Wt 216 lb 3.2 oz (98.068 kg)  BMI 31.02 kg/m2  SpO2 98%  LMP 11/05/2014 We discussed the above issues for 15 minutes.  This chart was scribed in my presence and reviewed by me personally.    ICD-9-CM ICD-10-CM   1. 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 Gastroesophageal reflux disease without esophagitis 530.81 K21.9 pantoprazole (PROTONIX) 40 MG tablet     Signed, Elvina Sidle, MD

## 2014-11-30 NOTE — Patient Instructions (Signed)
Consider contacting Dr. Ponciano Ort at [facility] to see about transplantation here in [place]

## 2014-12-03 ENCOUNTER — Ambulatory Visit
Admission: RE | Admit: 2014-12-03 | Discharge: 2014-12-03 | Disposition: A | Discharge status: Home / Self Care | Payer: BLUE CROSS/BLUE SHIELD | Source: Ambulatory Visit | Attending: Family Medicine | Admitting: Family Medicine

## 2014-12-03 DIAGNOSIS — Z524 Kidney donor: Secondary | ICD-10-CM

## 2014-12-04 ENCOUNTER — Other Ambulatory Visit: Payer: Self-pay

## 2014-12-31 ENCOUNTER — Ambulatory Visit (INDEPENDENT_AMBULATORY_CARE_PROVIDER_SITE_OTHER): Payer: BLUE CROSS/BLUE SHIELD | Admitting: Family Medicine

## 2014-12-31 VITALS — BP 110/70 | HR 92 | Temp 98.2°F | Resp 16 | Ht 70.0 in | Wt 219.0 lb

## 2014-12-31 DIAGNOSIS — R300 Dysuria: Secondary | ICD-10-CM | POA: Diagnosis not present

## 2014-12-31 DIAGNOSIS — N3000 Acute cystitis without hematuria: Secondary | ICD-10-CM

## 2014-12-31 LAB — POCT URINALYSIS DIPSTICK
Bilirubin, UA: NEGATIVE
GLUCOSE UA: NEGATIVE
Ketones, UA: NEGATIVE
Nitrite, UA: NEGATIVE
PROTEIN UA: NEGATIVE
Spec Grav, UA: 1.02
Urobilinogen, UA: 0.2
pH, UA: 5

## 2014-12-31 LAB — POCT UA - MICROSCOPIC ONLY
BACTERIA, U MICROSCOPIC: NEGATIVE
CRYSTALS, UR, HPF, POC: NEGATIVE
Casts, Ur, LPF, POC: NEGATIVE
MUCUS UA: NEGATIVE
Yeast, UA: NEGATIVE

## 2014-12-31 MED ORDER — CEPHALEXIN 500 MG PO CAPS: 500.0000 mg | ORAL_CAPSULE | Freq: Two times a day (BID) | ORAL | Status: DC

## 2014-12-31 NOTE — Progress Notes (Addendum)
[facility] [address] [place] [address] [phone]  Date:  12/31/2014   Name:  Kirsten Hoover   DOB:  07-30-1975   MRN:  [id number]  PCP:  Pcp Not In System    Chief Complaint: Dysuria   History of Present Illness:  Kirsten Hoover is a 39 y.o. very pleasant female patient who presents with the following:  She is here today with likely UTI- she has noted urinary frequency and dysuria, no hematuria.  No fever, belly or back pain. No vomiting.  No vaginal sx She is generally in good health  Patient Active Problem List   Diagnosis Date Noted  . Plantar fasciitis, bilateral 02/23/2014  . Sciatica 02/21/2013  . Right axillary mass 07/16/2012  . Sickle cell trait 07/01/2012    Past Medical History  Diagnosis Date  . AMA (advanced maternal age) multigravida 35+   . Sciatica   . Sickle cell trait   . Prolonged rupture of membranes, greater than 24 hours, delivered 02/23/2013    26 hours   . Vaginal delivery 02/22/2013  . Advanced maternal age (AMA) in pregnancy 07/29/2012  . Hyperemesis arising during pregnancy 07/16/2012  . SAB (spontaneous abortion) October 2013 03/29/2012  . UTI (urinary tract infection) 02/21/2013  . Vaginal irritation 02/21/2013    Past Surgical History  Procedure Laterality Date  . No past surgeries      History  Substance Use Topics  . Smoking status: Never Smoker   . Smokeless tobacco: Never Used  . Alcohol Use: No    Family History  Problem Relation Age of Onset  . Other Neg Hx   . Hypertension Mother   . Hypertension Father     No Known Allergies  Medication list has been reviewed and updated.  Current Outpatient Prescriptions on File Prior to Visit  Medication Sig Dispense Refill  . pantoprazole (PROTONIX) 40 MG tablet Take 1 tablet (40 mg total) by mouth daily. 30 tablet 3   No current facility-administered medications on file prior to visit.    Review of Systems:  As per HPI- otherwise  negative.   Physical Examination: Filed Vitals:   12/31/14 1655  BP: 110/70  Pulse: 92  Temp: 98.2 F (36.8 C)  Resp: 16   Filed Vitals:   12/31/14 1655  Height:  (1.778 m)  Weight: 219 lb (99.338 kg)   Body mass index is 31.42 kg/(m^2). Ideal Body Weight: Weight in (lb) to have BMI = 25: 173.9  GEN: WDWN, NAD, Non-toxic, A & O x 3 HEENT: Atraumatic, Normocephalic. Neck supple. No masses, No LAD. Ears and Nose: No external deformity. CV: RRR, No M/G/R. No JVD. No thrill. No extra heart sounds. PULM: CTA B, no wheezes, crackles, rhonchi. No retractions. No resp. distress. No accessory muscle use. ABD: S, ND, No rebound. No HSM.  Mild suprapubic TTP, no CVA tenderness  EXTR: No c/c/e NEURO Normal gait.  PSYCH: Normally interactive. Conversant. Not depressed or anxious appearing.  Calm demeanor.   Results for orders placed or performed in visit on 12/31/14  POCT urinalysis dipstick  Result Value Ref Range   Color, UA yellow    Clarity, UA sl cloudy    Glucose, UA neg    Bilirubin, UA neg    Ketones, UA neg    Spec Grav, UA 1.020    Blood, UA mod    pH, UA 5.0    Protein, UA neg    Urobilinogen, UA  0.2    Nitrite, UA neg    Leukocytes, UA small (1+) (A) Negative  POCT UA - Microscopic Only  Result Value Ref Range   WBC, Ur, HPF, POC 5-10    RBC, urine, microscopic 8-10    Bacteria, U Microscopic neg    Mucus, UA neg    Epithelial cells, urine per micros 2-4    Crystals, Ur, HPF, POC neg    Casts, Ur, LPF, POC neg    Yeast, UA neg     Assessment and Plan: Acute cystitis without hematuria - Plan: cephALEXin (KEFLEX) 500 MG capsule, Urine culture  Dysuria - Plan: POCT urinalysis dipstick, POCT UA - Microscopic Only, cephALEXin (KEFLEX) 500 MG capsule  Treat for cystitis with keflex twice a day for one week- await culture and will follow-up with her  Signed Abbe AmsterdamJessica Keyandre Pileggi, MD  Called to discuss with her on 7/17- she reports that she feels better but  still has some dysuria. Let her know that her urine culture grew just a small amount of bacteria- probably does not signify a UTI.  She had some blood in her urine but states that she did not have her menses at the time of visit.  Explained that we need to see her back for a recheck to determine the cause of her symptoms.  Will also send a letter

## 2014-12-31 NOTE — Patient Instructions (Signed)
We are going to treat you for a UTI with keflex antibiotic twice a day for one week I will call you if we need to change your antibiotic for any reason

## 2015-01-02 LAB — URINE CULTURE: Colony Count: 3000

## 2015-01-03 ENCOUNTER — Encounter: Payer: Self-pay | Admitting: Family Medicine

## 2015-03-04 ENCOUNTER — Ambulatory Visit (INDEPENDENT_AMBULATORY_CARE_PROVIDER_SITE_OTHER): Payer: BLUE CROSS/BLUE SHIELD | Admitting: Family Medicine

## 2015-03-04 VITALS — BP 108/68 | HR 87 | Temp 98.4°F | Resp 17 | Ht 69.5 in | Wt 216.0 lb

## 2015-03-04 DIAGNOSIS — R109 Unspecified abdominal pain: Secondary | ICD-10-CM

## 2015-03-04 DIAGNOSIS — Z905 Acquired absence of kidney: Secondary | ICD-10-CM | POA: Diagnosis not present

## 2015-03-04 DIAGNOSIS — D509 Iron deficiency anemia, unspecified: Secondary | ICD-10-CM | POA: Diagnosis not present

## 2015-03-04 DIAGNOSIS — Z524 Kidney donor: Secondary | ICD-10-CM

## 2015-03-04 LAB — POCT UA - MICROSCOPIC ONLY
CASTS, UR, LPF, POC: NEGATIVE
CRYSTALS, UR, HPF, POC: NEGATIVE
Mucus, UA: NEGATIVE
WBC, Ur, HPF, POC: NEGATIVE
YEAST UA: NEGATIVE

## 2015-03-04 LAB — COMPLETE METABOLIC PANEL WITH GFR
ALT: 8 U/L (ref 6–29)
AST: 11 U/L (ref 10–30)
Albumin: 3.9 g/dL (ref 3.6–5.1)
Alkaline Phosphatase: 46 U/L (ref 33–115)
BILIRUBIN TOTAL: 0.2 mg/dL (ref 0.2–1.2)
BUN: 13 mg/dL (ref 7–25)
CHLORIDE: 103 mmol/L (ref 98–110)
CO2: 27 mmol/L (ref 20–31)
Calcium: 9.6 mg/dL (ref 8.6–10.2)
Creat: 0.78 mg/dL (ref 0.50–1.10)
GLUCOSE: 87 mg/dL (ref 65–99)
POTASSIUM: 4.1 mmol/L (ref 3.5–5.3)
SODIUM: 139 mmol/L (ref 135–146)
TOTAL PROTEIN: 6.8 g/dL (ref 6.1–8.1)

## 2015-03-04 LAB — IRON AND TIBC
%SAT: 20 % (ref 11–50)
Iron: 65 ug/dL (ref 40–190)
TIBC: 319 ug/dL (ref 250–450)
UIBC: 254 ug/dL (ref 125–400)

## 2015-03-04 LAB — POCT CBC
GRANULOCYTE PERCENT: 57.4 % (ref 37–80)
HEMATOCRIT: 36.1 % — AB (ref 37.7–47.9)
Hemoglobin: 11.1 g/dL — AB (ref 12.2–16.2)
Lymph, poc: 3.2 (ref 0.6–3.4)
MCH: 24.6 pg — AB (ref 27–31.2)
MCHC: 30.9 g/dL — AB (ref 31.8–35.4)
MCV: 79.8 fL — AB (ref 80–97)
MID (CBC): 0.1 (ref 0–0.9)
MPV: 8.1 fL (ref 0–99.8)
PLATELET COUNT, POC: 432 10*3/uL — AB (ref 142–424)
POC GRANULOCYTE: 4.4 (ref 2–6.9)
POC LYMPH %: 41.4 % (ref 10–50)
POC MID %: 1.2 %M (ref 0–12)
RBC: 4.53 M/uL (ref 4.04–5.48)
RDW, POC: 13.4 %
WBC: 7.7 10*3/uL (ref 4.6–10.2)

## 2015-03-04 LAB — POCT URINALYSIS DIPSTICK
Bilirubin, UA: NEGATIVE
Glucose, UA: NEGATIVE
KETONES UA: NEGATIVE
Leukocytes, UA: NEGATIVE
Nitrite, UA: NEGATIVE
PH UA: 5.5
PROTEIN UA: NEGATIVE
SPEC GRAV UA: 1.015
UROBILINOGEN UA: 0.2

## 2015-03-04 NOTE — Progress Notes (Signed)
Donated kidney 3 weeks ago Subjective:  Patient ID: Kirsten Hoover, female    DOB: [birth year]  Age: 39 y.o. MRN: [id number]  39 year old lady who went to [place] and donated a kidney to her brother from [place] 3 weeks ago. She came back to the [place] last week. Her sutures were already removed. She has done well. She is still weak and has flank pain, but is continuing to improve and doing well. She brought a folder full of labs and studies done in [place] on both her and her brother. She has a mother of 4 children, youngest one 54 years old, does not plan on any more children. She does work a job, not sure when she is going back to work yet. Objective:   Pleasant lady, alert and oriented. Chest clear. Heart regular without murmurs. Abdomen soft without mass or tenderness. Right flank incision well-healed. Is still little tender in the CVA area. Extremities normal.  Assessment & Plan:   Assessment:  Kidney donor with residual flank pain. Postoperative weakness.  Plan:  CBC, urinalysis, comprehensive metabolic panel  Results for orders placed or performed in visit on 03/04/15  POCT CBC  Result Value Ref Range   WBC 7.7 4.6 - 10.2 K/uL   Lymph, poc 3.2 0.6 - 3.4   POC LYMPH PERCENT 41.4 10 - 50 %L   MID (cbc) 0.1 0 - 0.9   POC MID % 1.2 0 - 12 %M   POC Granulocyte 4.4 2 - 6.9   Granulocyte percent 57.4 37 - 80 %G   RBC 4.53 4.04 - 5.48 M/uL   Hemoglobin 11.1 (A) 12.2 - 16.2 g/dL   HCT, POC 82.9 (A) 56.2 - 47.9 %   MCV 79.8 (A) 80 - 97 fL   MCH, POC 24.6 (A) 27 - 31.2 pg   MCHC 30.9 (A) 31.8 - 35.4 g/dL   RDW, POC 13.0 %   Platelet Count, POC 432 (A) 142 - 424 K/uL   MPV 8.1 0 - 99.8 fL  POCT UA - Microscopic Only  Result Value Ref Range   WBC, Ur, HPF, POC negative    RBC, urine, microscopic 1-3    Bacteria, U Microscopic trace    Mucus, UA negative    Epithelial cells, urine per micros 2-8    Crystals, Ur, HPF, POC negative    Casts, Ur, LPF, POC negative    Yeast, UA negative    POCT urinalysis dipstick  Result Value Ref Range   Color, UA yellow    Clarity, UA clear    Glucose, UA negative    Bilirubin, UA negative    Ketones, UA negative    Spec Grav, UA 1.015    Blood, UA moderate    pH, UA 5.5    Protein, UA negative    Urobilinogen, UA 0.2    Nitrite, UA negative    Leukocytes, UA Negative Negative     Patient Instructions   Continue to drink plenty of fluids to keep your kidneys flushed out well.   Try to eat a balanced diet, but get plenty of greens.   take over-the-counter iron 1 pill twice daily for 1 month, then 1 pill once daily for 2 more months. If  Twice daily causes you stomach upset just take 1.    try to get some regular exercise to rebuild your strength   Return at anytime if problems, otherwise recheck in one month before you return to work    anemia  With her ongoing weakness and pain we'll see her in one month before she goes back to work.  Jaleyah Longhi, MD 03/04/2015

## 2015-03-04 NOTE — Patient Instructions (Signed)
Continue to drink plenty of fluids to keep your kidneys flushed out well.   Try to eat a balanced diet, but get plenty of greens.   take over-the-counter iron 1 pill twice daily for 1 month, then 1 pill once daily for 2 more months. If  Twice daily causes you stomach upset just take 1.    try to get some regular exercise to rebuild your strength   Return at anytime if problems, otherwise recheck in one month before you return to work

## 2015-03-08 ENCOUNTER — Encounter: Payer: Self-pay | Admitting: Family Medicine

## 2015-03-17 ENCOUNTER — Telehealth: Payer: Self-pay

## 2015-03-17 NOTE — Telephone Encounter (Signed)
Patient has forms to be completed by Dr. Alwyn Ren to submit to [facility], I have filled out what I could from the OV notes but I need Dr. Alwyn Ren to look this over and sign it. Please return to FMLA/Disability box at [address] check out desk within 5-7 business days. Thank you!

## 2015-03-24 DIAGNOSIS — Z0271 Encounter for disability determination: Secondary | ICD-10-CM

## 2015-03-24 NOTE — Telephone Encounter (Signed)
[employer] faxed in some more forms to be completed by Dr. Alwyn Ren, I will fill out what I can from the OV notes and highlight what else needs to be completed. I will place them in your box today on 03/24/15 please sign and return to the FMLA box at the [address] checkout desk within 5-7 business days. Thank you!

## 2015-03-29 ENCOUNTER — Encounter: Payer: Self-pay | Admitting: *Deleted

## 2015-03-29 ENCOUNTER — Ambulatory Visit (INDEPENDENT_AMBULATORY_CARE_PROVIDER_SITE_OTHER): Payer: BLUE CROSS/BLUE SHIELD | Admitting: Family Medicine

## 2015-03-29 VITALS — BP 121/82 | HR 76 | Temp 98.4°F | Resp 18 | Ht 69.5 in | Wt 221.4 lb

## 2015-03-29 DIAGNOSIS — D6489 Other specified anemias: Secondary | ICD-10-CM

## 2015-03-29 DIAGNOSIS — Z524 Kidney donor: Secondary | ICD-10-CM | POA: Diagnosis not present

## 2015-03-29 DIAGNOSIS — R5383 Other fatigue: Secondary | ICD-10-CM | POA: Diagnosis not present

## 2015-03-29 DIAGNOSIS — Z905 Acquired absence of kidney: Secondary | ICD-10-CM | POA: Diagnosis not present

## 2015-03-29 LAB — POCT CBC
Granulocyte percent: 45.8 %G (ref 37–80)
HCT, POC: 35.6 % — AB (ref 37.7–47.9)
HEMOGLOBIN: 11.7 g/dL — AB (ref 12.2–16.2)
LYMPH, POC: 4 — AB (ref 0.6–3.4)
MCH, POC: 26.1 pg — AB (ref 27–31.2)
MCHC: 32.8 g/dL (ref 31.8–35.4)
MCV: 79.5 fL — AB (ref 80–97)
MID (cbc): 0.6 (ref 0–0.9)
MPV: 7.8 fL (ref 0–99.8)
POC Granulocyte: 3.8 (ref 2–6.9)
POC LYMPH PERCENT: 47.1 %L (ref 10–50)
POC MID %: 7.1 %M (ref 0–12)
Platelet Count, POC: 332 10*3/uL (ref 142–424)
RBC: 4.48 M/uL (ref 4.04–5.48)
RDW, POC: 13.1 %
WBC: 8.4 10*3/uL (ref 4.6–10.2)

## 2015-03-29 NOTE — Progress Notes (Signed)
Patient ID: Kirsten Hoover, female    DOB: 15-Oct-1975  Age: 39 y.o. MRN: [id number]  Chief Complaint  Patient presents with  . Follow-up    F/u from kidney surgery  . Flu Vaccine    Subjective:   39 year old lady who comes back in for a recheck status post donating a kidney. She is doing satisfactorily though ending over and lifting something still hurts her good deal. She has to move boxes constantly with her job. She still feels tired. No other major acute problems.  Her brother who receive the kidney is still doing well.  Current allergies, medications, problem list, past/family and social histories reviewed.  Objective:  BP 121/82 mmHg  Pulse 76  Temp(Src) 98.4 F (36.9 C) (Oral)  Resp 18  Ht 5' 9.5" (1.765 m)  Wt 221 lb 6 oz (100.415 kg)  BMI 32.23 kg/m2  SpO2 98%  LMP 02/20/2015  No acute distress. Her abdomen is soft without masses or tenderness. Surgical wound is healed excellently. It is a long wound all the way around the right flank. Results for orders placed or performed in visit on 03/29/15  POCT CBC  Result Value Ref Range   WBC 8.4 4.6 - 10.2 K/uL   Lymph, poc 4.0 (A) 0.6 - 3.4   POC LYMPH PERCENT 47.1 10 - 50 %L   MID (cbc) 0.6 0 - 0.9   POC MID % 7.1 0 - 12 %M   POC Granulocyte 3.8 2 - 6.9   Granulocyte percent 45.8 37 - 80 %G   RBC 4.48 4.04 - 5.48 M/uL   Hemoglobin 11.7 (A) 12.2 - 16.2 g/dL   HCT, POC 40.9 (A) 81.1 - 47.9 %   MCV 79.5 (A) 80 - 97 fL   MCH, POC 26.1 (A) 27 - 31.2 pg   MCHC 32.8 31.8 - 35.4 g/dL   RDW, POC 91.4 %   Platelet Count, POC 332 142 - 424 K/uL   MPV 7.8 0 - 99.8 fL  .  Assessment & Plan:   Assessment: 1. Kidney donor   2. History of nephrectomy   3. 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 Other fatigue       Plan: Advised her that she should continue taking iron for about 2 months after we get her hemoglobin back up. She does have sickle trait, so may have a hard time getting up to a truly normal hemoglobin.  Orders  Placed This Encounter  Procedures  . COMPLETE METABOLIC PANEL WITH GFR  . POCT CBC    Meds ordered this encounter  Medications  . ferrous fumarate (HEMOCYTE - 106 MG FE) 325 (106 FE) MG TABS tablet    Sig: Take 1 tablet by mouth.         Patient Instructions  Continue to stay off work for the next couple of weeks. Follow-up with Dr. Milus Glazier at that time. He is due to be here on the 24th from 1 until 8:30 PM, but in case the schedule changes you should call ahead of time to make sure that he was here before coming.   Continue taking your iron.  Avoid heavy lifting and straining  Return at anytime if needed.      Return in about 2 weeks (around 04/12/2015).   Camren Lipsett, MD 03/29/2015

## 2015-03-29 NOTE — Patient Instructions (Addendum)
Continue to stay off work for the next couple of weeks. Follow-up with Dr. Milus Glazier at that time. He is due to be here on the 24th from 1 until 8:30 PM, but in case the schedule changes you should call ahead of time to make sure that he was here before coming.   Continue taking your iron.  Avoid heavy lifting and straining  Return at anytime if needed.

## 2015-03-30 LAB — COMPLETE METABOLIC PANEL WITH GFR
ALBUMIN: 4.2 g/dL (ref 3.6–5.1)
ALK PHOS: 56 U/L (ref 33–115)
ALT: 6 U/L (ref 6–29)
AST: 11 U/L (ref 10–30)
BUN: 13 mg/dL (ref 7–25)
CALCIUM: 9.5 mg/dL (ref 8.6–10.2)
CO2: 26 mmol/L (ref 20–31)
CREATININE: 0.82 mg/dL (ref 0.50–1.10)
Chloride: 102 mmol/L (ref 98–110)
GFR, Est African American: >89 mL/min (ref >=60)
GFR, Est Non African American: 89 mL/min (ref >=60)
Glucose, Bld: 90 mg/dL (ref 65–99)
POTASSIUM: 4.1 mmol/L (ref 3.5–5.3)
Sodium: 136 mmol/L (ref 135–146)
Total Bilirubin: 0.2 mg/dL (ref 0.2–1.2)
Total Protein: 7.1 g/dL (ref 6.1–8.1)

## 2015-03-30 NOTE — Telephone Encounter (Signed)
Patient called to check the status of her paperwork, it is still needing to be completed by Dr. Alwyn Ren and is in his box.

## 2015-03-31 NOTE — Telephone Encounter (Signed)
Done

## 2015-03-31 NOTE — Telephone Encounter (Signed)
Forms scanned and patient notified that she can come pick them up.

## 2015-04-05 ENCOUNTER — Ambulatory Visit (INDEPENDENT_AMBULATORY_CARE_PROVIDER_SITE_OTHER): Payer: BLUE CROSS/BLUE SHIELD | Admitting: Family Medicine

## 2015-04-05 VITALS — BP 102/70 | HR 69 | Temp 98.3°F | Resp 16 | Ht 69.5 in | Wt 221.0 lb

## 2015-04-05 DIAGNOSIS — R42 Dizziness and giddiness: Secondary | ICD-10-CM

## 2015-04-05 DIAGNOSIS — R110 Nausea: Secondary | ICD-10-CM

## 2015-04-05 MED ORDER — MECLIZINE HCL 25 MG PO TABS: 25.0000 mg | ORAL_TABLET | Freq: Three times a day (TID) | ORAL | Status: DC | PRN

## 2015-04-05 NOTE — Progress Notes (Signed)
Patient ID: Kirsten Hoover, female    DOB: 08/19/1975  Age: 39 y.o. MRN: [id number]  Chief Complaint  Patient presents with  . Dizziness    Onset yesterday  . Nausea  . Headache  . Fullness in ears    Subjective:   Patient is here for a problem she's been having for the last couple days with dizziness. She feels nauseated. She has had normal menstrual cycles ever since she had a kidney donation. She is feeling okay otherwise.  No fever. Nothing else new.    Current allergies, medications, problem list, past/family and social histories reviewed.  Objective:  BP 102/70 mmHg  Pulse 69  Temp(Src) 98.3 F (36.8 C) (Oral)  Resp 16  Ht 5' 9.5" (1.765 m)  Wt 221 lb (100.245 kg)  BMI 32.18 kg/m2  SpO2 95%  LMP 03/22/2015  Looks like she feels a little tired. Her TMs are normal. Eyes PERRLA. EOMs intact. Fundi benign. Pterygiums bilaterally starting to develop. Throat clear. Neck supple without nodes or thyromegaly. No carotid bruits. Chest clear. Heart regular without murmur. Blood pressure was elevated when she came in but I'll recheck it and it was back down into her normal range which is fairly low.  Assessment & Plan:   Assessment: 1. Dizziness   2. Nausea without vomiting       Plan: Dizziness Nausea History of anemia Will treat with Antivert and see how she does. If the nausea persists we'll call in some Zofran for her. If she is not doing better by about Thursday we'll try some Valium. No lab workup done today since everything was normal one week ago.  Meds ordered this encounter  Medications  . meclizine (ANTIVERT) 25 MG tablet    Sig: Take 1 tablet (25 mg total) by mouth 3 (three) times daily as needed for dizziness or nausea.    Dispense:  30 tablet    Refill:  1      Patient Instructions  Take the meclizine one half to one pill 3 times daily as needed for dizziness. It may make you feel a little sleepy.  Drink lots of water  If you're not doing a lot  better by Thursday call me so I can try giving something different. If you get worse before then let me know.     Return if symptoms worsen or fail to improve.   Kirsten Camino, MD 04/05/2015

## 2015-04-05 NOTE — Patient Instructions (Signed)
Take the meclizine one half to one pill 3 times daily as needed for dizziness. It may make you feel a little sleepy.  Drink lots of water  If you're not doing a lot better by Thursday call me so I can try giving something different. If you get worse before then let me know.

## 2015-04-12 ENCOUNTER — Ambulatory Visit (INDEPENDENT_AMBULATORY_CARE_PROVIDER_SITE_OTHER): Payer: BLUE CROSS/BLUE SHIELD | Admitting: Family Medicine

## 2015-04-12 VITALS — BP 122/74 | HR 78 | Temp 98.2°F | Resp 17 | Ht 70.5 in | Wt 223.0 lb

## 2015-04-12 DIAGNOSIS — Z524 Kidney donor: Secondary | ICD-10-CM

## 2015-04-12 DIAGNOSIS — Z23 Encounter for immunization: Secondary | ICD-10-CM

## 2015-04-12 DIAGNOSIS — R42 Dizziness and giddiness: Secondary | ICD-10-CM

## 2015-04-12 NOTE — Patient Instructions (Signed)
Please return in 2 weeks to see if we can increase your work shifts.

## 2015-04-12 NOTE — Progress Notes (Addendum)
° °  Subjective:    Patient ID: Kirsten Hoover, female    DOB: 12/24/1975, 39 y.o.   MRN: [id number] This chart was scribed for Elvina SidleKurt Lauenstein, MD by Littie Deedsichard Sun, Medical Scribe. This patient was seen in Room 12 and the patient's care was started at 3:41 PM.    HPI HPI Comments: Kirsten Hoover is a 39 y.o. female who presents to the [facility] for a follow-up. Patient was initially seen by me wanting to get cleared for donating a kidney (right) to her brother. Her brother, who is still in [place], has been doing well since the kidney implant. She had some dizziness last week and had been seen by Dr. Alwyn RenHopper. Patient notes that she no longer has pain to her kidney. She denies abdominal pain, cough, sore throat, fever, and urinary symptoms.  Patient is from [place] originally and speaks Arabic primarily. She works in Primary school teacherinspection at [employer] and feels ready to return to work, but with light duty and reduced hours. She usually works 12-hour shifts. Her job does involve some lifting.   Review of Systems  Constitutional: Negative for fever.  HENT: Negative for sore throat.   Respiratory: Negative for cough.   Gastrointestinal: Negative for abdominal pain.  Genitourinary: Negative.        Objective:   Physical Exam CONSTITUTIONAL: Well developed/well nourished HEAD: Normocephalic/atraumatic EYES: EOM/PERRL ENMT: Mucous membranes moist NECK: supple no meningeal signs SPINE: entire spine nontender CV: S1/S2 noted, no murmurs/rubs/gallops noted LUNGS: Lungs are clear to auscultation bilaterally, no apparent distress ABDOMEN: soft, nontender, no rebound or guarding. Well-healed surgical scar. GU: no cva tenderness NEURO: Pt is awake/alert, moves all extremitiesx4 EXTREMITIES: pulses normal, full ROM SKIN: warm, color normal PSYCH: no abnormalities of mood noted        Assessment & Plan:   By signing my name below, I, Littie Deedsichard Sun, attest that this documentation has been  prepared under the direction and in the presence of Elvina SidleKurt Lauenstein, MD.  Electronically Signed: Littie Deedsichard Sun, Medical Scribe. 04/12/2015. 3:22 PM. This chart was scribed in my presence and reviewed by me personally.    ICD-9-CM ICD-10-CM   1. Flu vaccine need V04.81 Z23 Flu Vaccine QUAD 36+ mos IM  2. Kidney donor V59.4 Z52.4   3. Dizziness and giddiness 780.4 R42      Signed, Elvina SidleKurt Lauenstein, MD

## 2015-04-15 ENCOUNTER — Ambulatory Visit (INDEPENDENT_AMBULATORY_CARE_PROVIDER_SITE_OTHER): Payer: BLUE CROSS/BLUE SHIELD | Admitting: Emergency Medicine

## 2015-04-15 VITALS — BP 112/60 | HR 70 | Temp 98.2°F | Resp 16 | Ht 70.5 in | Wt 224.2 lb

## 2015-04-15 DIAGNOSIS — N3000 Acute cystitis without hematuria: Secondary | ICD-10-CM

## 2015-04-15 LAB — POC MICROSCOPIC URINALYSIS (UMFC): MUCUS RE: ABSENT

## 2015-04-15 LAB — POCT URINALYSIS DIP (MANUAL ENTRY)
BILIRUBIN UA: NEGATIVE
GLUCOSE UA: NEGATIVE
Ketones, POC UA: NEGATIVE
NITRITE UA: POSITIVE — AB
Spec Grav, UA: 1.015
Urobilinogen, UA: 0.2
pH, UA: 5

## 2015-04-15 MED ORDER — PHENAZOPYRIDINE HCL 200 MG PO TABS: 200.0000 mg | ORAL_TABLET | Freq: Three times a day (TID) | ORAL | Status: DC | PRN

## 2015-04-15 MED ORDER — CIPROFLOXACIN HCL 500 MG PO TABS: 500.0000 mg | ORAL_TABLET | Freq: Two times a day (BID) | ORAL | Status: DC

## 2015-04-15 NOTE — Progress Notes (Signed)
Subjective:  Patient ID: Kirsten Hoover, female    DOB: 05/11/1976  Age: 39 y.o. MRN: [id number]  CC: painful urination   HPI Kirsten Hoover presents  48 hour history of dysuria urgency and frequency. Kirsten Hoover is currently having Kirsten Hoover menstrual cycle. Kirsten Hoover has no fever chills nausea vomiting back pain. No vaginal discharge or dyspareunia.  History Kirsten Hoover has a past medical history of AMA (advanced maternal age) multigravida 35+; Sciatica; Sickle cell trait (HCC); Prolonged rupture of membranes, greater than 24 hours, delivered (02/23/2013); Vaginal delivery (02/22/2013); Advanced maternal age (AMA) in pregnancy (07/29/2012); Hyperemesis arising during pregnancy (07/16/2012); SAB (spontaneous abortion) October 2013 (03/29/2012); UTI (urinary tract infection) (02/21/2013); and Vaginal irritation (02/21/2013).   Kirsten Hoover has past surgical history that includes No past surgeries and Kidney donation.   Kirsten Hoover  family history includes Hypertension in Kirsten Hoover father and mother. There is no history of Other.  Kirsten Hoover   reports that Kirsten Hoover has never smoked. Kirsten Hoover has never used smokeless tobacco. Kirsten Hoover reports that Kirsten Hoover does not drink alcohol or use illicit drugs.  Outpatient Prescriptions Prior to Visit  Medication Sig Dispense Refill  . ferrous fumarate (HEMOCYTE - 106 MG FE) 325 (106 FE) MG TABS tablet Take 1 tablet by mouth.    . meclizine (ANTIVERT) 25 MG tablet Take 1 tablet (25 mg total) by mouth 3 (three) times daily as needed for dizziness or nausea. 30 tablet 1  . pantoprazole (PROTONIX) 40 MG tablet Take 1 tablet (40 mg total) by mouth daily. 30 tablet 3   No facility-administered medications prior to visit.    Social History   Social History  . Marital Status: Married    Spouse Name: Fatehalrhman Abdalrhim  . Number of Children: 3  . Years of Education: 15   Occupational History  . INSPECTION    Social History Main Topics  . Smoking status: Never Smoker   . Smokeless tobacco: Never Used  . Alcohol Use:  No  . Drug Use: No  . Sexual Activity: Yes    Birth Control/ Protection: None   Other Topics Concern  . None   Social History Narrative     Review of Systems  Constitutional: Negative for fever, chills and appetite change.  HENT: Negative for congestion, ear pain, postnasal drip, sinus pressure and sore throat.   Eyes: Negative for pain and redness.  Respiratory: Negative for cough, shortness of breath and wheezing.   Cardiovascular: Negative for leg swelling.  Gastrointestinal: Negative for nausea, vomiting, abdominal pain, diarrhea, constipation and blood in stool.  Endocrine: Negative for polyuria.  Genitourinary: Positive for dysuria, urgency and frequency. Negative for flank pain.  Musculoskeletal: Negative for gait problem.  Skin: Negative for rash.  Neurological: Negative for weakness and headaches.  Psychiatric/Behavioral: Negative for confusion and decreased concentration. The patient is not nervous/anxious.     Objective:  BP 112/60 mmHg  Pulse 70  Temp(Src) 98.2 F (36.8 C) (Oral)  Resp 16  Ht 5' 10.5" (1.791 m)  Wt 224 lb 3.2 oz (101.696 kg)  BMI 31.70 kg/m2  LMP 03/22/2015  Physical Exam  Constitutional: Kirsten Hoover is oriented to person, place, and time. Kirsten Hoover appears well-developed and well-nourished. No distress.  HENT:  Head: Normocephalic and atraumatic.  Right Ear: External ear normal.  Left Ear: External ear normal.  Nose: Nose normal.  Eyes: Conjunctivae and EOM are normal. Pupils are equal, round, and reactive to light. No scleral icterus.  Neck: Normal range of motion. Neck supple. No tracheal deviation present.  Cardiovascular: Normal rate, regular rhythm and normal heart sounds.   Pulmonary/Chest: Effort normal. No respiratory distress. Kirsten Hoover has no wheezes. Kirsten Hoover has no rales.  Abdominal: Kirsten Hoover exhibits no mass. There is no tenderness. There is no rebound and no guarding.  Musculoskeletal: Kirsten Hoover exhibits no edema.  Lymphadenopathy:    Kirsten Hoover has no cervical  adenopathy.  Neurological: Kirsten Hoover is alert and oriented to person, place, and time. Coordination normal.  Skin: Skin is warm and dry. No rash noted.  Psychiatric: Kirsten Hoover has a normal mood and affect. Kirsten Hoover behavior is normal.      Assessment & Plan:   Kirsten Hoover was seen today for painful urination.  Diagnoses and all orders for this visit:  Acute cystitis without hematuria -     POCT urinalysis dipstick -     POCT Microscopic Urinalysis ([facility])  Other orders -     ciprofloxacin (CIPRO) 500 MG tablet; Take 1 tablet (500 mg total) by mouth 2 (two) times daily. -     phenazopyridine (PYRIDIUM) 200 MG tablet; Take 1 tablet (200 mg total) by mouth 3 (three) times daily as needed.   I am having Kirsten Hoover start on ciprofloxacin and phenazopyridine. I am also having Kirsten Hoover maintain Kirsten Hoover pantoprazole, ferrous fumarate, and meclizine.  Meds ordered this encounter  Medications  . ciprofloxacin (CIPRO) 500 MG tablet    Sig: Take 1 tablet (500 mg total) by mouth 2 (two) times daily.    Dispense:  20 tablet    Refill:  0  . phenazopyridine (PYRIDIUM) 200 MG tablet    Sig: Take 1 tablet (200 mg total) by mouth 3 (three) times daily as needed.    Dispense:  6 tablet    Refill:  0    Appropriate red flag conditions were discussed with the patient as well as actions that should be taken.  Patient expressed his understanding.  Follow-up: Return if symptoms worsen or fail to improve.  Carmelina Dane, MD   Results for orders placed or performed in visit on 04/15/15  POCT urinalysis dipstick  Result Value Ref Range   Color, UA yellow yellow   Clarity, UA clear clear   Glucose, UA negative negative   Bilirubin, UA negative negative   Ketones, POC UA negative negative   Spec Grav, UA 1.015    Blood, UA large (A) negative   pH, UA 5.0    Protein Ur, POC trace (A) negative   Urobilinogen, UA 0.2    Nitrite, UA Positive (A) Negative   Leukocytes, UA Trace (A) Negative  POCT Microscopic  Urinalysis ([facility])  Result Value Ref Range   WBC,UR,HPF,POC Few (A) None WBC/hpf   RBC,UR,HPF,POC Moderate (A) None RBC/hpf   Bacteria Few (A) None, Too numerous to count   Mucus Absent Absent   Epithelial Cells, UR Per Microscopy Few (A) None, Too numerous to count cells/hpf

## 2015-04-15 NOTE — Patient Instructions (Signed)

## 2015-04-26 ENCOUNTER — Ambulatory Visit (INDEPENDENT_AMBULATORY_CARE_PROVIDER_SITE_OTHER): Payer: BLUE CROSS/BLUE SHIELD | Admitting: Family Medicine

## 2015-04-26 VITALS — BP 116/72 | HR 83 | Temp 98.0°F | Resp 18 | Ht 70.0 in | Wt 225.0 lb

## 2015-04-26 DIAGNOSIS — Z905 Acquired absence of kidney: Secondary | ICD-10-CM

## 2015-04-26 DIAGNOSIS — M549 Dorsalgia, unspecified: Secondary | ICD-10-CM | POA: Diagnosis not present

## 2015-04-26 NOTE — Patient Instructions (Signed)
Referral for physical therapy  Be more active  Return in 1 month, sooner if worse or if much better

## 2015-04-26 NOTE — Progress Notes (Signed)
Patient ID: Kirsten Hoover, female    DOB: 01/21/1976  Age: 39 y.o. MRN: [id number]  Chief Complaint  Patient presents with  . Follow-up    surgery 8/25    Subjective:   Continues to have right back pain, expecially with lifting.  The job said they had no light duty, to stay off until she cold lift the boxes.  She is fairly sedentary at home.  Is willing to consider PT.   Current allergies, medications, problem list, past/family and social histories reviewed.  Objective:  BP 116/72 mmHg  Pulse 83  Temp(Src) 98 F (36.7 C) (Oral)  Resp 18  Ht 5\' 10"  (1.778 m)  Wt 225 lb (102.059 kg)  BMI 32.28 kg/m2  SpO2 98%  LMP 04/13/2015  Tender right flank.  Pain with left lateral flexion.  Hurts to flex anteriorly.       Assessment & Plan:   Assessment: 1. Mid back pain on right side   2. History of nephrectomy, unilateral       Plan: PT for work strengthening  Patient wanted a letter for her brother in [place], over to the [place] as he is recovering from his kidney transplant. I told her he was not officially outpatient are really could not write that for her  Orders Placed This Encounter  Procedures  . Ambulatory referral to Physical Therapy    Referral Priority:  Routine    Referral Type:  Physical Medicine    Referral Reason:  Specialty Services Required    Requested Specialty:  Physical Therapy    Number of Visits Requested:  1    No orders of the defined types were placed in this encounter.         Patient Instructions  Referral for physical therapy  Be more active  Return in 1 month, sooner if worse or if much better     Return in about 1 month (around 05/26/2015).   HOPPER,DAVID, MD 04/26/2015

## 2015-04-29 ENCOUNTER — Telehealth: Payer: Self-pay | Admitting: Family Medicine

## 2015-04-29 NOTE — Telephone Encounter (Signed)
Received fax from [employer]. An attending physician statement has been placed in Dr. Frederik PearHopper's box. It is already complete, please review and sign. Please return to [facility] at checkout upon completion. I will fax the form plus the office visit notes.   Thanks, [employer]

## 2015-05-07 NOTE — Telephone Encounter (Signed)
Forms completed and scanned in and faxed over with OV notes on 05/07/15

## 2015-05-20 ENCOUNTER — Ambulatory Visit (INDEPENDENT_AMBULATORY_CARE_PROVIDER_SITE_OTHER): Payer: BLUE CROSS/BLUE SHIELD | Admitting: Family Medicine

## 2015-05-20 VITALS — BP 102/60 | HR 80 | Temp 98.6°F | Resp 18 | Ht 70.0 in | Wt 221.0 lb

## 2015-05-20 DIAGNOSIS — Z524 Kidney donor: Secondary | ICD-10-CM

## 2015-05-20 DIAGNOSIS — L819 Disorder of pigmentation, unspecified: Secondary | ICD-10-CM | POA: Diagnosis not present

## 2015-05-20 DIAGNOSIS — R109 Unspecified abdominal pain: Secondary | ICD-10-CM | POA: Diagnosis not present

## 2015-05-20 DIAGNOSIS — M5489 Other dorsalgia: Secondary | ICD-10-CM | POA: Diagnosis not present

## 2015-05-20 NOTE — Progress Notes (Signed)
Patient ID: Kirsten Hoover, female    DOB: 10/09/1975  Age: 39 y.o. MRN: [id number]  Chief Complaint  Patient presents with  . Follow-up    back pain    Subjective:   Taking physical therapy has helped the back, though she still has pain. She thinks she can probably work an 8 hour shift rather than the previous 12 are shifts. There is no light duty at the job place. She had previously been given a note to return to work on December 7. I would like her to see her physical therapist again before going back to work, so we will continue with the December 7 return date. She is otherwise doing well, and her brother who received a kidney is doing well and going back to [place].  Patient also has some pigmented rash-like lesions on her cheeks. She was told that some vitamin might help this. I do not think so, and told her I think she needs to be seen by dermatologist. She is in agreement for that.   Current allergies, medications, problem list, past/family and social histories reviewed.  Objective:  BP 102/60 mmHg  Pulse 80  Temp(Src) 98.6 F (37 C) (Oral)  Resp 18  Ht 5\' 10"  (1.778 m)  Wt 221 lb (100.245 kg)  BMI 31.71 kg/m2  SpO2 98%  LMP 05/12/2015  Straight leg raising negative. Abdomen soft and nontender. Right flank is very tender still.  Splotchy pigmented areas on both cheeks  Assessment & Plan:   Assessment: 1. Kidney donor   2. Other back pain   3. Right flank pain   4. Pigmented skin lesion       Plan: Return to work as directed in the letter.        Patient Instructions  Return to work. We are requesting an 8 hour shift.  Follow up with the physical therapy again. It would be good if they could follow you for another couple of weeks until you are stable back in your job place. Talked to the physical therapist about what exactly you have to do on the job.  Return here as needed. I would like to have you come back in about 2 months to recheck some kidney  functions one more time.  Referral to dermatologist     No Follow-up on file.   HOPPER,DAVID, MD 05/20/2015

## 2015-05-20 NOTE — Patient Instructions (Addendum)
Return to work. We are requesting an 8 hour shift.  Follow up with the physical therapy again. It would be good if they could follow you for another couple of weeks until you are stable back in your job place. Talked to the physical therapist about what exactly you have to do on the job.  Return here as needed. I would like to have you come back in about 2 months to recheck some kidney functions one more time.  Referral to dermatologist

## 2015-10-18 ENCOUNTER — Ambulatory Visit (INDEPENDENT_AMBULATORY_CARE_PROVIDER_SITE_OTHER): Payer: BLUE CROSS/BLUE SHIELD | Admitting: Family Medicine

## 2015-10-18 VITALS — BP 120/74 | HR 82 | Temp 98.5°F | Resp 16 | Ht 70.0 in | Wt 223.4 lb

## 2015-10-18 DIAGNOSIS — Z524 Kidney donor: Secondary | ICD-10-CM | POA: Insufficient documentation

## 2015-10-18 DIAGNOSIS — Z905 Acquired absence of kidney: Secondary | ICD-10-CM

## 2015-10-18 DIAGNOSIS — N3090 Cystitis, unspecified without hematuria: Secondary | ICD-10-CM | POA: Diagnosis not present

## 2015-10-18 DIAGNOSIS — R300 Dysuria: Secondary | ICD-10-CM | POA: Diagnosis not present

## 2015-10-18 LAB — POCT URINALYSIS DIP (MANUAL ENTRY)
BILIRUBIN UA: NEGATIVE
BILIRUBIN UA: NEGATIVE
Glucose, UA: NEGATIVE
Nitrite, UA: POSITIVE — AB
PH UA: 6
PROTEIN UA: NEGATIVE
SPEC GRAV UA: 1.01
Urobilinogen, UA: 0.2

## 2015-10-18 LAB — BASIC METABOLIC PANEL
BUN: 12 mg/dL (ref 7–25)
CALCIUM: 9.4 mg/dL (ref 8.6–10.2)
CO2: 26 mmol/L (ref 20–31)
CREATININE: 0.88 mg/dL (ref 0.50–1.10)
Chloride: 106 mmol/L (ref 98–110)
Glucose, Bld: 85 mg/dL (ref 65–99)
Potassium: 4.3 mmol/L (ref 3.5–5.3)
Sodium: 141 mmol/L (ref 135–146)

## 2015-10-18 LAB — POC MICROSCOPIC URINALYSIS (UMFC): MUCUS RE: ABSENT

## 2015-10-18 MED ORDER — PHENAZOPYRIDINE HCL 200 MG PO TABS: 200.0000 mg | ORAL_TABLET | Freq: Three times a day (TID) | ORAL | Status: AC | PRN

## 2015-10-18 MED ORDER — CIPROFLOXACIN HCL 500 MG PO TABS: 500.0000 mg | ORAL_TABLET | Freq: Two times a day (BID) | ORAL | Status: DC

## 2015-10-18 NOTE — Progress Notes (Signed)
Patient ID: Radford PaxAlnema A Carrigan, female    DOB: 12/26/1975  Age: 40 y.o. MRN: [id number]  Chief Complaint  Patient presents with  . Dysuria    x 1 day ago     Subjective:   Pleasant lady well-known to me. She has developed dysuria for the last couple of days with frequent episodes of nocturia. No other systemic symptoms.  She does work a job. She had questions regarding Ramadan and the fast due to the fact that a year ago she donated a kidney to her brother. The surgery was done in [place]. We have followed her here and she has done well postoperatively.  Current allergies, medications, problem list, past/family and social histories reviewed.  Objective:  BP 120/74 mmHg  Pulse 82  Temp(Src) 98.5 F (36.9 C) (Oral)  Resp 16  Ht 5\' 10"  (1.778 m)  Wt 223 lb 6.4 oz (101.334 kg)  BMI 32.05 kg/m2  SpO2 100%  LMP 10/11/2015  No major distress. No CVA tenderness. Abdomen soft without masses or tenderness.  Assessment & Plan:   Assessment: 1. Dysuria       Plan: Treat for the cystitis. Also check a blood chemistries since she has a single kidney. Recommend she follows up periodically for that.  Orders Placed This Encounter  Procedures  . POCT Microscopic Urinalysis ([facility])  . POCT urinalysis dipstick    No orders of the defined types were placed in this encounter.         Patient Instructions       IF you received an x-ray today, you will receive an invoice from [facility]. Please contact [facility] at [phone] with questions or concerns regarding your invoice.   IF you received labwork today, you will receive an invoice from [facility]. Please contact [facility] at [phone] with questions or concerns regarding your invoice.   Our billing staff will not be able to assist you with questions regarding bills from these companies.  You will be contacted with the lab results as soon as they are available. The fastest way  to get your results is to activate your My Chart account. Instructions are located on the last page of this paperwork. If you have not heard from us regarding the results in 2 weeks, please contact this office.         No Follow-up on file.   HOPPER,DAVID, MD 10/18/2015

## 2015-10-18 NOTE — Patient Instructions (Addendum)
Take the ciprofloxacin 500 mg one twice daily for 5 days  Use the Pyridium 1 pill 3 times daily only if needed for urinary pain or frequency. It will make your urine a dark brownish red color.  Continue to drink plenty of water  I will let you know the results of your labs in a few days. Assuming that they are okay, it is fine for you to fast during Ramadan but make sure that you drink plenty of water early in the morning and late in the evening to protect your single kidney.  If your labs are good, return every 6 months to 1 year to get your kidney function tests. I will be retired, but one of the others will care for you.    IF you received an x-ray today, you will receive an invoice from [facility]. Please contact [facility] at [phone] with questions or concerns regarding your invoice.   IF you received labwork today, you will receive an invoice from [facility]. Please contact [facility] at [phone] with questions or concerns regarding your invoice.   Our billing staff will not be able to assist you with questions regarding bills from these companies.  You will be contacted with the lab results as soon as they are available. The fastest way to get your results is to activate your My Chart account. Instructions are located on the last page of this paperwork. If you have not heard from us regarding the results in 2 weeks, please contact this office.

## 2015-10-18 NOTE — Addendum Note (Signed)
Addended by: Felix AhmadiFRANSEN, Asharia Lotter A on: 10/18/2015 11:35 AM   Modules accepted: [employer]

## 2015-10-20 LAB — URINE CULTURE

## 2015-10-21 ENCOUNTER — Telehealth: Payer: Self-pay

## 2015-10-21 NOTE — Telephone Encounter (Signed)
-----   Message from Peyton Najjaravid H Hopper, MD sent at 10/20/2015 11:25 PM EDT ----- Should respond well to the prescribed antibiotic for her UTI  Chemistries normal

## 2015-10-21 NOTE — Telephone Encounter (Signed)
IC patient results message per Dr. Alwyn RenHopper.  She states she is feeling better. Patient states Dr. Alwyn RenHopper was going to let her know if it is ok to fast during Ramadan (beginning May 27) She states there is nothing by mouth from 5am to 9pm.  After 9pm, she is able to drink water, etc.   Advised pt we would call her back on what Dr. Alwyn RenHopper says.

## 2015-12-31 ENCOUNTER — Encounter (HOSPITAL_COMMUNITY): Payer: Self-pay

## 2015-12-31 ENCOUNTER — Ambulatory Visit: Payer: BLUE CROSS/BLUE SHIELD

## 2015-12-31 ENCOUNTER — Emergency Department (HOSPITAL_COMMUNITY)
Admission: EM | Admit: 2015-12-31 | Discharge: 2015-12-31 | Disposition: A | Discharge status: Home / Self Care | Payer: BLUE CROSS/BLUE SHIELD | Attending: Emergency Medicine | Admitting: Emergency Medicine

## 2015-12-31 DIAGNOSIS — Z79899 Other long term (current) drug therapy: Secondary | ICD-10-CM | POA: Diagnosis not present

## 2015-12-31 DIAGNOSIS — N390 Urinary tract infection, site not specified: Secondary | ICD-10-CM | POA: Diagnosis not present

## 2015-12-31 DIAGNOSIS — R300 Dysuria: Secondary | ICD-10-CM | POA: Diagnosis present

## 2015-12-31 LAB — CBC WITH DIFFERENTIAL/PLATELET
BASOS ABS: 0 10*3/uL (ref 0.0–0.1)
Basophils Relative: 0 %
EOS PCT: 2 %
Eosinophils Absolute: 0.2 10*3/uL (ref 0.0–0.7)
HCT: 35.9 % — ABNORMAL LOW (ref 36.0–46.0)
HEMOGLOBIN: 12.1 g/dL (ref 12.0–15.0)
LYMPHS ABS: 4 10*3/uL (ref 0.7–4.0)
LYMPHS PCT: 46 %
MCH: 26.7 pg (ref 26.0–34.0)
MCHC: 33.7 g/dL (ref 30.0–36.0)
MCV: 79.2 fL (ref 78.0–100.0)
Monocytes Absolute: 0.6 10*3/uL (ref 0.1–1.0)
Monocytes Relative: 7 %
NEUTROS ABS: 4 10*3/uL (ref 1.7–7.7)
NEUTROS PCT: 45 %
PLATELETS: 385 10*3/uL (ref 150–400)
RBC: 4.53 MIL/uL (ref 3.87–5.11)
RDW: 13.1 % (ref 11.5–15.5)
WBC: 8.8 10*3/uL (ref 4.0–10.5)

## 2015-12-31 LAB — URINALYSIS, ROUTINE W REFLEX MICROSCOPIC
BILIRUBIN URINE: NEGATIVE
GLUCOSE, UA: NEGATIVE mg/dL
KETONES UR: NEGATIVE mg/dL
NITRITE: POSITIVE — AB
Protein, ur: NEGATIVE mg/dL
SPECIFIC GRAVITY, URINE: 1.016 (ref 1.005–1.030)
pH: 5 (ref 5.0–8.0)

## 2015-12-31 LAB — BASIC METABOLIC PANEL
ANION GAP: 6 (ref 5–15)
BUN: 12 mg/dL (ref 6–20)
CHLORIDE: 107 mmol/L (ref 101–111)
CO2: 26 mmol/L (ref 22–32)
Calcium: 9.2 mg/dL (ref 8.9–10.3)
Creatinine, Ser: 0.8 mg/dL (ref 0.44–1.00)
GFR calc Af Amer: >60 mL/min (ref >60)
GLUCOSE: 68 mg/dL (ref 65–99)
POTASSIUM: 3.7 mmol/L (ref 3.5–5.1)
Sodium: 139 mmol/L (ref 135–145)

## 2015-12-31 LAB — URINE MICROSCOPIC-ADD ON

## 2015-12-31 LAB — PREGNANCY, URINE: PREG TEST UR: NEGATIVE

## 2015-12-31 MED ORDER — DEXTROSE 5 % IV SOLN
1.0000 g | Freq: Once | INTRAVENOUS | Status: AC
  Administered 2015-12-31: 1 g via INTRAVENOUS
  Filled 2015-12-31: qty 10

## 2015-12-31 MED ORDER — CEPHALEXIN 250 MG PO CAPS
250.0000 mg | ORAL_CAPSULE | Freq: Four times a day (QID) | ORAL | Status: DC
Start: 2015-12-31 — End: 2017-03-18

## 2015-12-31 NOTE — ED Notes (Signed)
Pt with burning with urination x 2 days.  Pelvic pain.  No fever.  Hx of UTI

## 2015-12-31 NOTE — ED Provider Notes (Signed)
CSN: [id number]     Arrival date & time 12/31/15  1812 History   First MD Initiated Contact with Patient 12/31/15 2040     Chief Complaint  Patient presents with  . Dysuria     (Consider location/radiation/quality/duration/timing/severity/associated sxs/prior Treatment) HPI Comments: Patient presents to the emergency department with chief complaint of dysuria and history of solitary kidney.  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  She states that the dysuria started 3 days ago.  She denies any fevers, chills, nausea, or vomiting.  She does complain of some suprapubic abdominal pain.  There are no other symptoms.  The history is provided by the patient. No language interpreter was used.    Past Medical History  Diagnosis Date  . AMA (advanced maternal age) multigravida 35+   . Sciatica   . Sickle cell trait (HCC)   . Prolonged rupture of membranes, greater than 24 hours, delivered 02/23/2013    26 hours   . Vaginal delivery 02/22/2013  . Advanced maternal age (AMA) in pregnancy 07/29/2012  . Hyperemesis arising during pregnancy 07/16/2012  . SAB (spontaneous abortion) October 2013 03/29/2012  . UTI (urinary tract infection) 02/21/2013  . Vaginal irritation 02/21/2013   Past Surgical History  Procedure Laterality Date  . No past surgeries    . Kidney donation     Family History  Problem Relation Age of Onset  . Other Neg Hx   . Hypertension Mother   . Hypertension Father    Social History  Substance Use Topics  . Smoking status: Never Smoker   . Smokeless tobacco: Never Used  . Alcohol Use: No   OB History    Gravida Para Term Preterm AB TAB SAB Ectopic Multiple Living   5 4 4  1  1   4      Review of Systems  Constitutional: Negative for fever and chills.  Respiratory: Negative for shortness of breath.   Cardiovascular: Negative for chest pain.  Gastrointestinal: Negative for nausea, vomiting, diarrhea and constipation.  Genitourinary: Positive for dysuria.  Negative for flank pain.  All other systems reviewed and are negative.     Allergies  Review of patient's allergies indicates no known allergies.  Home Medications   Prior to Admission medications   Medication Sig Start Date End Date Taking? Authorizing Provider  phenazopyridine (PYRIDIUM) 200 MG tablet Take 1 tablet (200 mg total) by mouth 3 (three) times daily as needed. Patient taking differently: Take 200 mg by mouth 3 (three) times daily as needed for pain.  10/18/15  Yes Peyton Najjar, MD  ciprofloxacin (CIPRO) 500 MG tablet Take 1 tablet (500 mg total) by mouth 2 (two) times daily. Patient not taking: Reported on 12/31/2015 10/18/15   Peyton Najjar, MD  meclizine (ANTIVERT) 25 MG tablet Take 1 tablet (25 mg total) by mouth 3 (three) times daily as needed for dizziness or nausea. Patient not taking: Reported on 12/31/2015 04/05/15   Peyton Najjar, MD  pantoprazole (PROTONIX) 40 MG tablet Take 1 tablet (40 mg total) by mouth daily. Patient not taking: Reported on 12/31/2015 11/30/14   Elvina Sidle, MD   BP 119/82 mmHg  Pulse 77  Temp(Src) 98.3 F (36.8 C) (Oral)  Resp 19  SpO2 100%  LMP 12/24/2015 Physical Exam  Constitutional: She is oriented to person, place, and time. She appears well-developed and well-nourished.  HENT:  Head: Normocephalic and atraumatic.  Eyes: Conjunctivae and EOM are normal. Pupils are equal, round, and reactive  to light.  Neck: Normal range of motion. Neck supple.  Cardiovascular: Normal rate and regular rhythm.  Exam reveals no gallop and no friction rub.   No murmur heard. Pulmonary/Chest: Effort normal and breath sounds normal. No respiratory distress. She has no wheezes. She has no rales. She exhibits no tenderness.  Abdominal: Soft. Bowel sounds are normal. She exhibits no distension and no mass. There is no tenderness. There is no rebound and no guarding.  No focal abdominal tenderness, no RLQ tenderness or pain at McBurney's point, no RUQ  tenderness or Murphy's sign, no left-sided abdominal tenderness, no fluid wave, or signs of peritonitis   Musculoskeletal: Normal range of motion. She exhibits no edema or tenderness.  Neurological: She is alert and oriented to person, place, and time.  Skin: Skin is warm and dry.  Psychiatric: She has a normal mood and affect. Her behavior is normal. Judgment and thought content normal.  Nursing note and vitals reviewed.   ED Course  Procedures (including critical care time) Results for orders placed or performed during the hospital encounter of 12/31/15  Urinalysis, Routine w reflex microscopic (not at [facility])  Result Value Ref Range   Color, Urine AMBER (A) YELLOW   APPearance CLOUDY (A) CLEAR   Specific Gravity, Urine 1.016 1.005 - 1.030   pH 5.0 5.0 - 8.0   Glucose, UA NEGATIVE NEGATIVE mg/dL   Hgb urine dipstick MODERATE (A) NEGATIVE   Bilirubin Urine NEGATIVE NEGATIVE   Ketones, ur NEGATIVE NEGATIVE mg/dL   Protein, ur NEGATIVE NEGATIVE mg/dL   Nitrite POSITIVE (A) NEGATIVE   Leukocytes, UA MODERATE (A) NEGATIVE  Urine microscopic-add on  Result Value Ref Range   Squamous Epithelial / LPF 0-5 (A) NONE SEEN   WBC, UA 6-30 0 - 5 WBC/hpf   RBC / HPF 0-5 0 - 5 RBC/hpf   Bacteria, UA RARE (A) NONE SEEN  Pregnancy, urine  Result Value Ref Range   Preg Test, Ur NEGATIVE NEGATIVE  CBC with Differential/Platelet  Result Value Ref Range   WBC 8.8 4.0 - 10.5 K/uL   RBC 4.53 3.87 - 5.11 MIL/uL   Hemoglobin 12.1 12.0 - 15.0 g/dL   HCT 16.135.9 (L) 09.636.0 - 04.546.0 %   MCV 79.2 78.0 - 100.0 fL   MCH 26.7 26.0 - 34.0 pg   MCHC 33.7 30.0 - 36.0 g/dL   RDW 40.913.1 81.111.5 - 91.415.5 %   Platelets 385 150 - 400 K/uL   Neutrophils Relative % 45 %   Neutro Abs 4.0 1.7 - 7.7 K/uL   Lymphocytes Relative 46 %   Lymphs Abs 4.0 0.7 - 4.0 K/uL   Monocytes Relative 7 %   Monocytes Absolute 0.6 0.1 - 1.0 K/uL   Eosinophils Relative 2 %   Eosinophils Absolute 0.2 0.0 - 0.7 K/uL   Basophils Relative 0 %    Basophils Absolute 0.0 0.0 - 0.1 K/uL  Basic metabolic panel  Result Value Ref Range   Sodium 139 135 - 145 mmol/L   Potassium 3.7 3.5 - 5.1 mmol/L   Chloride 107 101 - 111 mmol/L   CO2 26 22 - 32 mmol/L   Glucose, Bld 68 65 - 99 mg/dL   BUN 12 6 - 20 mg/dL   Creatinine, Ser 7.820.80 0.44 - 1.00 mg/dL   Calcium 9.2 8.9 - 95.610.3 mg/dL   GFR calc non Af Amer >60 >60 mL/min   GFR calc Af Amer >60 >60 mL/min   Anion gap 6 5 - 15  No results found.    MDM   Final diagnoses:  UTI (lower urinary tract infection)    Patient with dysuria.  UA consistent with UTI.  Preg test negative.  No suspicion for obstruction.  Patient does have a solitary kidney.  She donated kidney to her brother last year.    Kidney function is normal today.  VSS.  Given rocephin in ED.  Discussed with Dr. Effie Shy, who agrees with plan for discharge.    Roxy Horseman, PA-C 12/31/15 2221  Mancel Bale, MD 01/01/16 [phone]

## 2015-12-31 NOTE — Discharge Instructions (Signed)

## 2016-01-02 LAB — URINE CULTURE: Culture: <10000 — AB

## 2016-08-19 ENCOUNTER — Emergency Department (HOSPITAL_COMMUNITY)
Admission: EM | Admit: 2016-08-19 | Discharge: 2016-08-20 | Disposition: A | Discharge status: Home / Self Care | Payer: BLUE CROSS/BLUE SHIELD | Attending: Emergency Medicine | Admitting: Emergency Medicine

## 2016-08-19 ENCOUNTER — Emergency Department (HOSPITAL_COMMUNITY): Payer: BLUE CROSS/BLUE SHIELD

## 2016-08-19 ENCOUNTER — Encounter (HOSPITAL_COMMUNITY): Payer: Self-pay

## 2016-08-19 DIAGNOSIS — R112 Nausea with vomiting, unspecified: Secondary | ICD-10-CM | POA: Diagnosis not present

## 2016-08-19 DIAGNOSIS — M545 Low back pain, unspecified: Secondary | ICD-10-CM

## 2016-08-19 DIAGNOSIS — R1030 Lower abdominal pain, unspecified: Secondary | ICD-10-CM | POA: Insufficient documentation

## 2016-08-19 DIAGNOSIS — R110 Nausea: Secondary | ICD-10-CM

## 2016-08-19 LAB — CBC
HEMATOCRIT: 39.5 % (ref 36.0–46.0)
HEMOGLOBIN: 13.4 g/dL (ref 12.0–15.0)
MCH: 26.3 pg (ref 26.0–34.0)
MCHC: 33.9 g/dL (ref 30.0–36.0)
MCV: 77.6 fL — ABNORMAL LOW (ref 78.0–100.0)
Platelets: 354 10*3/uL (ref 150–400)
RBC: 5.09 MIL/uL (ref 3.87–5.11)
RDW: 13.1 % (ref 11.5–15.5)
WBC: 5.4 10*3/uL (ref 4.0–10.5)

## 2016-08-19 LAB — COMPREHENSIVE METABOLIC PANEL
ALBUMIN: 3.9 g/dL (ref 3.5–5.0)
ALT: 15 U/L (ref 14–54)
ANION GAP: 7 (ref 5–15)
AST: 23 U/L (ref 15–41)
Alkaline Phosphatase: 53 U/L (ref 38–126)
BUN: 10 mg/dL (ref 6–20)
CO2: 23 mmol/L (ref 22–32)
Calcium: 9 mg/dL (ref 8.9–10.3)
Chloride: 109 mmol/L (ref 101–111)
Creatinine, Ser: 0.83 mg/dL (ref 0.44–1.00)
GFR calc Af Amer: >60 mL/min (ref >60)
GFR calc non Af Amer: >60 mL/min (ref >60)
GLUCOSE: 102 mg/dL — AB (ref 65–99)
POTASSIUM: 3.8 mmol/L (ref 3.5–5.1)
SODIUM: 139 mmol/L (ref 135–145)
Total Bilirubin: 0.4 mg/dL (ref 0.3–1.2)
Total Protein: 7.6 g/dL (ref 6.5–8.1)

## 2016-08-19 LAB — URINALYSIS, ROUTINE W REFLEX MICROSCOPIC
BILIRUBIN URINE: NEGATIVE
Glucose, UA: NEGATIVE mg/dL
KETONES UR: NEGATIVE mg/dL
Nitrite: NEGATIVE
Protein, ur: NEGATIVE mg/dL
Specific Gravity, Urine: 1.015 (ref 1.005–1.030)
pH: 5 (ref 5.0–8.0)

## 2016-08-19 LAB — LIPASE, BLOOD: Lipase: 16 U/L (ref 11–51)

## 2016-08-19 LAB — I-STAT BETA HCG BLOOD, ED (MC, WL, AP ONLY): I-stat hCG, quantitative: <5 m[IU]/mL (ref <5)

## 2016-08-19 MED ORDER — ONDANSETRON HCL 4 MG PO TABS: 4.0000 mg | ORAL_TABLET | Freq: Four times a day (QID) | ORAL | 0 refills | Status: DC

## 2016-08-19 MED ORDER — SODIUM CHLORIDE 0.9 % IV BOLUS (SEPSIS)
1000.0000 mL | Freq: Once | INTRAVENOUS | Status: AC
  Administered 2016-08-19: 1000 mL via INTRAVENOUS

## 2016-08-19 MED ORDER — IOPAMIDOL (ISOVUE-300) INJECTION 61%
INTRAVENOUS | Status: AC
  Administered 2016-08-19: 100 mL via INTRAVENOUS
  Filled 2016-08-19: qty 100

## 2016-08-19 MED ORDER — ONDANSETRON 4 MG PO TBDP
4.0000 mg | ORAL_TABLET | Freq: Once | ORAL | Status: AC | PRN
  Administered 2016-08-19: 4 mg via ORAL
  Filled 2016-08-19: qty 1

## 2016-08-19 MED ORDER — NAPROXEN 375 MG PO TABS: 375.0000 mg | ORAL_TABLET | Freq: Two times a day (BID) | ORAL | 0 refills | Status: DC

## 2016-08-19 NOTE — ED Notes (Signed)
PA at bedside.

## 2016-08-19 NOTE — ED Triage Notes (Addendum)
Pt c/o lower back pain 5/10, has been feeling lethargic and nauseated for the past 2 days. Reports some pain with urination. Pt also reports emesis x1 and diarrhea x1 today.

## 2016-08-19 NOTE — ED Notes (Signed)
ED Provider at bedside. 

## 2016-08-19 NOTE — ED Notes (Addendum)
N/V x 1 this morning. Pt is feeling weak and dizzy that started 2 days ago. Backache started about two weeks ago. Pt had food to eat at 4pm today, but not much besides that.

## 2016-08-19 NOTE — ED Provider Notes (Signed)
[facility]-EMERGENCY DEPT Provider Note   CSN: [id number] Arrival date & time: 08/19/16  2013 By signing my name below, I, Levon Hedger, attest that this documentation has been prepared under the direction and in the presence of non-physician practitioner, Buel Ream PA-C. Electronically Signed: Levon Hedger, Scribe. 08/19/2016. 9:05 PM.   History   Chief Complaint Chief Complaint  Patient presents with  . Back Pain  . Nausea   HPI Kirsten Hoover is a 41 y.o. female with a SHx of right nephrectomy who presents to the Emergency Department complaining of sudden onset, gradually worsening bilateral back pain onset two weeks ago, worse on the right side. She rates this pain 5/10 in severity; no alleviating or modifying factors noted. No recent injury or trauma. She reports associated nausea, diarrhea, dysuria, intermittent palpitations, lightheadedness upon standing, and generalized fatigue, all of which began yesterday. No treatments tried PTA. Pt states this feels like prior urinary tract infections. Per pt, her last period was last week and was normal.  She denies any fever, chills, urinary frequency, abdominal pain, blood in stool, pelvic pain, vaginal bleeding or discharge, CP, SOB, or cough. Of note, patient just returned from a month trip to [place] 4 days ago.  The history is provided by the patient. No language interpreter was used.    Past Medical History:  Diagnosis Date  . Advanced maternal age (AMA) in pregnancy 07/29/2012  . AMA (advanced maternal age) multigravida 35+   . Hyperemesis arising during pregnancy 07/16/2012  . Prolonged rupture of membranes, greater than 24 hours, delivered 02/23/2013   26 hours   . SAB (spontaneous abortion) October 2013 03/29/2012  . Sciatica   . Sickle cell trait (HCC)   . UTI (urinary tract infection) 02/21/2013  . Vaginal delivery 02/22/2013  . Vaginal irritation 02/21/2013    Patient Active Problem List   Diagnosis Date Noted  . Single kidney  10/18/2015  . Cystitis 10/18/2015  . 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 Plantar fasciitis, bilateral 02/23/2014  . Sciatica 02/21/2013  . Right axillary mass 07/16/2012  . Sickle cell trait (HCC) 07/01/2012    Past Surgical History:  Procedure Laterality Date  . KIDNEY DONATION    . NO PAST SURGERIES      OB History    Gravida Para Term Preterm AB Living   5 4 4   1 4    SAB TAB Ectopic Multiple Live Births   1       4       Home Medications    Prior to Admission medications   Medication Sig Start Date End Date Taking? Authorizing Provider  cephALEXin (KEFLEX) 250 MG capsule Take 1 capsule (250 mg total) by mouth 4 (four) times daily. Patient not taking: Reported on 08/19/2016 12/31/15   Roxy Horseman, PA-C  ciprofloxacin (CIPRO) 500 MG tablet Take 1 tablet (500 mg total) by mouth 2 (two) times daily. Patient not taking: Reported on 12/31/2015 10/18/15   Peyton Najjar, MD  meclizine (ANTIVERT) 25 MG tablet Take 1 tablet (25 mg total) by mouth 3 (three) times daily as needed for dizziness or nausea. Patient not taking: Reported on 12/31/2015 04/05/15   Peyton Najjar, MD  naproxen (NAPROSYN) 375 MG tablet Take 1 tablet (375 mg total) by mouth 2 (two) times daily. 08/19/16   Shelton Soler M Lisle Skillman, PA-C  ondansetron (ZOFRAN) 4 MG tablet Take 1 tablet (4 mg total) by mouth every 6 (six) hours. 08/19/16   Hasel Janish  M Jasalyn Frysinger, PA-C  pantoprazole (PROTONIX) 40 MG tablet Take 1 tablet (40 mg total) by mouth daily. Patient not taking: Reported on 12/31/2015 11/30/14   Elvina Sidle, MD  phenazopyridine (PYRIDIUM) 200 MG tablet Take 1 tablet (200 mg total) by mouth 3 (three) times daily as needed. Patient not taking: Reported on 08/19/2016 10/18/15   Peyton Najjar, MD    Family History Family History  Problem Relation Age of Onset  . Hypertension Mother   . Hypertension Father   . Other Neg Hx     Social History Social History  Substance Use Topics  . Smoking status: Never  Smoker  . Smokeless tobacco: Never Used  . Alcohol use No   Allergies   Patient has no known allergies.  Review of Systems Review of Systems  Constitutional: Positive for fatigue. Negative for chills and fever.  HENT: Negative for facial swelling and sore throat.   Respiratory: Negative for cough and shortness of breath.   Cardiovascular: Positive for palpitations. Negative for chest pain.  Gastrointestinal: Positive for diarrhea, nausea and vomiting. Negative for abdominal pain and blood in stool.  Genitourinary: Positive for dysuria. Negative for frequency, vaginal bleeding, vaginal discharge and vaginal pain.  Musculoskeletal: Positive for back pain.  Skin: Negative for rash and wound.  Neurological: Positive for light-headedness (upon standing). Negative for headaches.  Psychiatric/Behavioral: The patient is not nervous/anxious.    Physical Exam Updated Vital Signs BP 107/65 (BP Location: Left Arm)   Pulse 62   Temp 98 F (36.7 C) (Oral)   Resp 16   Ht 5\' 9"  (1.753 m)   LMP 08/12/2016   SpO2 100%   Physical Exam  Constitutional: She appears well-developed and well-nourished. No distress.  HENT:  Head: Normocephalic and atraumatic.  Mouth/Throat: Oropharynx is clear and moist. No oropharyngeal exudate.  Eyes: Conjunctivae are normal. Pupils are equal, round, and reactive to light. Right eye exhibits no discharge. Left eye exhibits no discharge. No scleral icterus.  Neck: Normal range of motion. Neck supple. No thyromegaly present.  Cardiovascular: Normal rate, regular rhythm, normal heart sounds and intact distal pulses.  Exam reveals no gallop and no friction rub.   No murmur heard. Pulmonary/Chest: Effort normal and breath sounds normal. No stridor. No respiratory distress. She has no wheezes. She has no rales.  Abdominal: Soft. Bowel sounds are normal. She exhibits no distension. There is tenderness in the suprapubic area. There is no rebound, no guarding and no CVA  tenderness (Scar over right CVA).  Musculoskeletal: She exhibits no edema.  Lymphadenopathy:    She has no cervical adenopathy.  Neurological: She is alert. Coordination normal.  Skin: Skin is warm and dry. No rash noted. She is not diaphoretic. No pallor.  Psychiatric: She has a normal mood and affect.  Nursing note and vitals reviewed.  ED Treatments / Results  DIAGNOSTIC STUDIES:  Oxygen Saturation is 100% on RA, normal by my interpretation.    COORDINATION OF CARE:  9:03 PM Discussed treatment plan with pt at bedside and pt agreed to plan.  Labs (all labs ordered are listed, but only abnormal results are displayed) Labs Reviewed  URINALYSIS, ROUTINE W REFLEX MICROSCOPIC - Abnormal; Notable for the following:       Result Value   Color, Urine YELLOW (*)    APPearance TURBID (*)    Hgb urine dipstick MODERATE (*)    Leukocytes, UA SMALL (*)    Bacteria, UA RARE (*)    Squamous Epithelial / LPF  6-30 (*)    All other components within normal limits  COMPREHENSIVE METABOLIC PANEL - Abnormal; Notable for the following:    Glucose, Bld 102 (*)    All other components within normal limits  CBC - Abnormal; Notable for the following:    MCV 77.6 (*)    All other components within normal limits  URINE CULTURE  LIPASE, BLOOD  I-STAT BETA HCG BLOOD, ED ([facility], [facility], [facility] ONLY)    EKG  EKG Interpretation None       Radiology Ct Abdomen Pelvis W Contrast  Result Date: 08/19/2016 CLINICAL DATA:  Subacute onset of back pain. Nausea, diarrhea and dysuria. Intermittent palpitations. Initial encounter. EXAM: CT ABDOMEN AND PELVIS WITH CONTRAST TECHNIQUE: Multidetector CT imaging of the abdomen and pelvis was performed using the standard protocol following bolus administration of intravenous contrast. CONTRAST:  100 mL ISOVUE-300 IOPAMIDOL (ISOVUE-300) INJECTION 61% COMPARISON:  Renal ultrasound performed 12/03/2014, and pelvic ultrasound performed 07/16/2012 FINDINGS: Lower chest: The  visualized lung bases are grossly clear. The visualized portions of the mediastinum are unremarkable. Hepatobiliary: The liver is unremarkable in appearance. The gallbladder is unremarkable in appearance. The common bile duct remains normal in caliber. Pancreas: The pancreas is within normal limits. Spleen: The spleen is unremarkable in appearance. Adrenals/Urinary Tract: The adrenal glands are unremarkable in appearance. The left kidney is unremarkable. The patient is status post right-sided nephrectomy. There is no evidence of hydronephrosis. No renal or ureteral stones are identified. No perinephric stranding is appreciated. Stomach/Bowel: The stomach is unremarkable in appearance. The small bowel is within normal limits. The appendix is normal in caliber, without evidence of appendicitis. The colon is unremarkable in appearance. Vascular/Lymphatic: The abdominal aorta is unremarkable in appearance. The inferior vena cava is grossly unremarkable. No retroperitoneal lymphadenopathy is seen. No pelvic sidewall lymphadenopathy is identified. Reproductive: The bladder is mildly distended and within normal limits. The uterus is grossly unremarkable in appearance. An intrauterine device is noted in expected position at the fundus of the uterus. The ovaries are relatively symmetric. No suspicious adnexal masses are seen. Other: No additional soft tissue abnormalities are seen. Musculoskeletal: No acute osseous abnormalities are identified. The visualized musculature is unremarkable in appearance. IMPRESSION: Unremarkable contrast-enhanced CT of the abdomen and pelvis. Electronically Signed   By: Roanna Raider M.D.   On: 08/19/2016 23:38    Procedures Procedures (including critical care time)  Medications Ordered in ED Medications  ondansetron (ZOFRAN-ODT) disintegrating tablet 4 mg (4 mg Oral Given 08/19/16 2033)  sodium chloride 0.9 % bolus 1,000 mL (0 mLs Intravenous Stopped 08/19/16 2340)  iopamidol  (ISOVUE-300) 61 % injection (100 mLs Intravenous Contrast Given 08/19/16 2253)     Initial Impression / Assessment and Plan / ED Course  I have reviewed the triage vital signs and the nursing notes.  Pertinent labs & imaging results that were available during my care of the patient were reviewed by me and considered in my medical decision making (see chart for details).     CBC, CMP, lipase unremarkable. Pregnancy negative. UA shows moderate hematuria, small leukocytes, 6-30 squamous epithelial cells. Urine culture sent. CT abdomen and pelvis unremarkable. Stable orthostatic vital signs. Patient given 1 L of fluids and Zofran in the ED with resolved symptoms. Could be viral syndrome, dehydration, musculoskeletal, however no emergent condition at this time. Patient is comfortable to go home. Follow-up to PCP this week for further evaluation and recheck of hematuria.  Strict return precautions given. Will discharge home with Zofran and naproxen.  Patient understands and agrees with plan. Patient vitals stable throughout ED course and discharged in satisfactory condition. Patient also evaluated by Dr. Denton LankSteinl who guided the patient's management and agrees with plan.  Final Clinical Impressions(s) / ED Diagnoses   Final diagnoses:  Nausea  Acute right-sided low back pain without sciatica   New Prescriptions New Prescriptions   NAPROXEN (NAPROSYN) 375 MG TABLET    Take 1 tablet (375 mg total) by mouth 2 (two) times daily.   ONDANSETRON (ZOFRAN) 4 MG TABLET    Take 1 tablet (4 mg total) by mouth every 6 (six) hours.  I personally performed the services described in this documentation, which was scribed in my presence. The recorded information has been reviewed and is accurate.    Emi Holeslexandra M Lalisa Kiehn, PA-C 08/20/16 0008    Cathren LaineKevin Steinl, MD 08/20/16 [phone]

## 2016-08-20 NOTE — Discharge Instructions (Signed)
Medications: Zofran, Naprosyn  Treatment: Take Zofran every 6 hours as needed for nausea or vomiting. Take Naprosyn twice daily as needed for your back pain. You can use ice or heating pad to your lower back 3-4 times daily alternating 20 minutes on, 20 minutes off. A urine culture was sent of your urine and you will be called in 2-3 days if an antibiotic is required for treatment.  Follow-up: Please follow-up with your primary care provider next week for follow-up of today's visit and further evaluation and treatment of your symptoms. Please return to emergency department if he develop any fevers, intractable vomiting, passing out, severe abdominal pain, or any other concerning symptoms.

## 2016-08-21 LAB — URINE CULTURE
Culture: 60000 — AB
Special Requests: NORMAL

## 2016-08-22 ENCOUNTER — Telehealth: Payer: Self-pay | Admitting: Emergency Medicine

## 2016-08-22 NOTE — Telephone Encounter (Signed)
Post ED Visit - Positive Culture Follow-up  Culture report reviewed by antimicrobial stewardship pharmacist:  []  Kirsten Hoover, Pharm.D. [x]  Kirsten Hoover, Pharm.D., BCPS []  Kirsten Hoover, Pharm.D. []  Kirsten Hoover, Pharm.D., BCPS []  [place]h Hoover, [address]Ph[address]D., BCPS, AAHIVP []  Estella HuskMichelle Hoover, Pharm.D., BCPS, AAHIVP []  Tennis Mustassie Hoover, Pharm.D. []  Sherle Poeob Vincent, [address]  Positive urine culture Treated with none, asymptomatic, no further patient follow-up is required at this time.  Berle MullMiller, Kirsten Hoover 08/22/2016, 4:31 PM

## 2017-03-17 ENCOUNTER — Encounter (HOSPITAL_COMMUNITY): Payer: Self-pay | Admitting: Emergency Medicine

## 2017-03-17 ENCOUNTER — Emergency Department (HOSPITAL_COMMUNITY): Payer: BLUE CROSS/BLUE SHIELD

## 2017-03-17 ENCOUNTER — Emergency Department (HOSPITAL_COMMUNITY)
Admission: EM | Admit: 2017-03-17 | Discharge: 2017-03-18 | Disposition: A | Discharge status: Home / Self Care | Payer: BLUE CROSS/BLUE SHIELD | Attending: Emergency Medicine | Admitting: Emergency Medicine

## 2017-03-17 DIAGNOSIS — R071 Chest pain on breathing: Secondary | ICD-10-CM | POA: Insufficient documentation

## 2017-03-17 DIAGNOSIS — R072 Precordial pain: Secondary | ICD-10-CM

## 2017-03-17 DIAGNOSIS — R079 Chest pain, unspecified: Secondary | ICD-10-CM | POA: Diagnosis present

## 2017-03-17 LAB — CBC
HEMATOCRIT: 35.5 % — AB (ref 36.0–46.0)
Hemoglobin: 12 g/dL (ref 12.0–15.0)
MCH: 26.4 pg (ref 26.0–34.0)
MCHC: 33.8 g/dL (ref 30.0–36.0)
MCV: 78 fL (ref 78.0–100.0)
Platelets: 323 10*3/uL (ref 150–400)
RBC: 4.55 MIL/uL (ref 3.87–5.11)
RDW: 13.3 % (ref 11.5–15.5)
WBC: 8.9 10*3/uL (ref 4.0–10.5)

## 2017-03-17 LAB — BASIC METABOLIC PANEL
ANION GAP: 7 (ref 5–15)
BUN: 16 mg/dL (ref 6–20)
CALCIUM: 9.8 mg/dL (ref 8.9–10.3)
CO2: 27 mmol/L (ref 22–32)
Chloride: 106 mmol/L (ref 101–111)
Creatinine, Ser: 0.88 mg/dL (ref 0.44–1.00)
Glucose, Bld: 123 mg/dL — ABNORMAL HIGH (ref 65–99)
POTASSIUM: 4.1 mmol/L (ref 3.5–5.1)
Sodium: 140 mmol/L (ref 135–145)

## 2017-03-17 LAB — URINALYSIS, ROUTINE W REFLEX MICROSCOPIC
BACTERIA UA: NONE SEEN
Bilirubin Urine: NEGATIVE
Glucose, UA: NEGATIVE mg/dL
Ketones, ur: NEGATIVE mg/dL
Leukocytes, UA: NEGATIVE
Nitrite: NEGATIVE
PH: 5 (ref 5.0–8.0)
Protein, ur: NEGATIVE mg/dL
SPECIFIC GRAVITY, URINE: 1.006 (ref 1.005–1.030)

## 2017-03-17 LAB — PREGNANCY, URINE: PREG TEST UR: NEGATIVE

## 2017-03-17 LAB — POCT I-STAT TROPONIN I: Troponin i, poc: 0.01 ng/mL (ref 0.00–0.08)

## 2017-03-17 MED ORDER — NAPROXEN 500 MG PO TABS
500.0000 mg | ORAL_TABLET | Freq: Once | ORAL | Status: AC
  Administered 2017-03-17: 500 mg via ORAL
  Filled 2017-03-17: qty 1

## 2017-03-17 MED ORDER — ACETAMINOPHEN 500 MG PO TABS
1000.0000 mg | ORAL_TABLET | Freq: Once | ORAL | Status: AC
  Administered 2017-03-17: 1000 mg via ORAL
  Filled 2017-03-17: qty 2

## 2017-03-17 MED ORDER — GI COCKTAIL ~~LOC~~
30.0000 mL | Freq: Once | ORAL | Status: AC
  Administered 2017-03-17: 30 mL via ORAL
  Filled 2017-03-17: qty 30

## 2017-03-17 NOTE — ED Notes (Addendum)
Pt reported to EDP and this Clinical research associate that symptoms started after crying for 24hrs.  Pt would not report why she was crying.

## 2017-03-17 NOTE — ED Triage Notes (Signed)
Patient complaining of central chest pain, headache, and left hip pain. Patient has not injured herself. Patient states this started yesterday. Patient does not have any other symptoms.

## 2017-03-18 ENCOUNTER — Encounter (HOSPITAL_COMMUNITY): Payer: Self-pay | Admitting: Emergency Medicine

## 2017-03-18 LAB — D-DIMER, QUANTITATIVE (NOT AT ARMC): D DIMER QUANT: 0.44 ug{FEU}/mL (ref 0.00–0.50)

## 2017-03-18 MED ORDER — NAPROXEN 375 MG PO TABS: 375.0000 mg | ORAL_TABLET | Freq: Two times a day (BID) | ORAL | 0 refills | Status: DC

## 2017-03-18 NOTE — ED Notes (Signed)
Verbalized understanding discharge instructions. In no acute distress.   

## 2017-03-18 NOTE — ED Provider Notes (Signed)
[facility]-EMERGENCY DEPT Provider Note   CSN: [id number] Arrival date & time: 03/17/17  2144     History   Chief Complaint Chief Complaint  Patient presents with  . Chest Pain    HPI Kirsten Hoover is a 41 y.o. female.  The history is provided by the patient.  Chest Pain   This is a new problem. The current episode started 2 days ago. The problem occurs constantly. The problem has not changed since onset.The pain is associated with an emotional upset (has been crying for 24 hours). The pain is present in the substernal region. The pain is mild. The quality of the pain is described as dull. The pain does not radiate. Pertinent negatives include no abdominal pain, no diaphoresis, no dizziness, no hemoptysis, no irregular heartbeat, no palpitations, no shortness of breath and no sputum production. Associated symptoms comments: Headache from crying. She has tried nothing for the symptoms. The treatment provided no relief. Risk factors include obesity.  Pertinent negatives for past medical history include no CAD and no PE.  Pertinent negatives for family medical history include: no aortic dissection and no early MI.  Procedure history is negative for stress echo.  No car trips or plane trips no leg pain or swelling.    Past Medical History:  Diagnosis Date  . Advanced maternal age (AMA) in pregnancy 07/29/2012  . AMA (advanced maternal age) multigravida 35+   . Hyperemesis arising during pregnancy 07/16/2012  . Prolonged rupture of membranes, greater than 24 hours, delivered 02/23/2013   26 hours   . SAB (spontaneous abortion) October 2013 03/29/2012  . Sciatica   . Sickle cell trait (HCC)   . UTI (urinary tract infection) 02/21/2013  . Vaginal delivery 02/22/2013  . Vaginal irritation 02/21/2013    Patient Active Problem List   Diagnosis Date Noted  . Single kidney 10/18/2015  . Cystitis 10/18/2015  . 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 Plantar fasciitis, bilateral  02/23/2014  . Sciatica 02/21/2013  . Right axillary mass 07/16/2012  . Sickle cell trait (HCC) 07/01/2012    Past Surgical History:  Procedure Laterality Date  . KIDNEY DONATION    . NO PAST SURGERIES      OB History    Gravida Para Term Preterm AB Living   SAB TAB Ectopic Multiple Live Births   1       4       Home Medications    Prior to Admission medications   Medication Sig Start Date End Date Taking? Authorizing Provider  ibuprofen (ADVIL,MOTRIN) 200 MG tablet Take 400 mg by mouth every 6 (six) hours as needed for headache, mild pain or moderate pain.   Yes [provider]  naproxen (NAPROSYN) 375 MG tablet Take 1 tablet (375 mg total) by mouth 2 (two) times daily. Patient not taking: Reported on 03/18/2017 08/19/16   Emi Holes, PA-C  ondansetron (ZOFRAN) 4 MG tablet Take 1 tablet (4 mg total) by mouth every 6 (six) hours. Patient not taking: Reported on 03/18/2017 08/19/16   Emi Holes, PA-C  pantoprazole (PROTONIX) 40 MG tablet Take 1 tablet (40 mg total) by mouth daily. Patient not taking: Reported on 12/31/2015 11/30/14   Elvina Sidle, MD  phenazopyridine (PYRIDIUM) 200 MG tablet Take 1 tablet (200 mg total) by mouth 3 (three) times daily as needed. Patient not taking: Reported on 08/19/2016 10/18/15   Peyton Najjar, MD  Family History Family History  Problem Relation Age of Onset  . Hypertension Mother   . Hypertension Father   . Other Neg Hx     Social History Social History  Substance Use Topics  . Smoking status: Never Smoker  . Smokeless tobacco: Never Used  . Alcohol use No     Allergies   Patient has no known allergies.   Review of Systems Review of Systems  Constitutional: Negative for diaphoresis.  Respiratory: Negative for hemoptysis, sputum production, chest tightness and shortness of breath.   Cardiovascular: Positive for chest pain. Negative for palpitations and leg swelling.  Gastrointestinal:  Negative for abdominal pain.  Neurological: Negative for dizziness.  All other systems reviewed and are negative.    Physical Exam Updated Vital Signs BP (!) 109/51   Pulse 62   Temp 98.5 F (36.9 C) (Oral)   Resp 14   Ht  (1.854 m)   Wt 105.7 kg (233 lb)   LMP 03/03/2017   SpO2 98%   BMI 30.74 kg/m   Physical Exam  Constitutional: She is oriented to person, place, and time. She appears well-developed and well-nourished. No distress.  HENT:  Head: Normocephalic and atraumatic.  Nose: Nose normal.  Mouth/Throat: No oropharyngeal exudate.  Eyes: Pupils are equal, round, and reactive to light. Conjunctivae are normal.  Neck: Normal range of motion. Neck supple. No JVD present.  Cardiovascular: Normal rate, regular rhythm, normal heart sounds and intact distal pulses.   Pulmonary/Chest: Effort normal and breath sounds normal. No stridor. She has no wheezes. She has no rales.  Abdominal: Soft. Bowel sounds are normal. She exhibits no mass. There is no tenderness. There is no rebound and no guarding.  Musculoskeletal: Normal range of motion. She exhibits no tenderness.  Lymphadenopathy:    She has no cervical adenopathy.  Neurological: She is alert and oriented to person, place, and time.  Skin: Skin is warm and dry. Capillary refill takes less than 2 seconds.  Psychiatric: She has a normal mood and affect.     ED Treatments / Results   Vitals:   03/18/17 0030 03/18/17 0100  BP: 114/65 (!) 109/51  Pulse: 60 62  Resp: 16 14  Temp:    SpO2: 97% 98%    Labs (all labs ordered are listed, but only abnormal results are displayed) Results for orders placed or performed during the hospital encounter of 03/17/17  Basic metabolic panel  Result Value Ref Range   Sodium 140 135 - 145 mmol/L   Potassium 4.1 3.5 - 5.1 mmol/L   Chloride 106 101 - 111 mmol/L   CO2 27 22 - 32 mmol/L   Glucose, Bld 123 (H) 65 - 99 mg/dL   BUN 16 6 - 20 mg/dL   Creatinine, Ser 4.09 0.44 -  1.00 mg/dL   Calcium 9.8 8.9 - 81.1 mg/dL   GFR calc non Af Amer >60 >60 mL/min   GFR calc Af Amer >60 >60 mL/min   Anion gap 7 5 - 15  CBC  Result Value Ref Range   WBC 8.9 4.0 - 10.5 K/uL   RBC 4.55 3.87 - 5.11 MIL/uL   Hemoglobin 12.0 12.0 - 15.0 g/dL   HCT 91.4 (L) 78.2 - 95.6 %   MCV 78.0 78.0 - 100.0 fL   MCH 26.4 26.0 - 34.0 pg   MCHC 33.8 30.0 - 36.0 g/dL   RDW 21.3 08.6 - 57.8 %   Platelets 323 150 - 400 K/uL  Urinalysis,  Routine w reflex microscopic  Result Value Ref Range   Color, Urine STRAW (A) YELLOW   APPearance CLEAR CLEAR   Specific Gravity, Urine 1.006 1.005 - 1.030   pH 5.0 5.0 - 8.0   Glucose, UA NEGATIVE NEGATIVE mg/dL   Hgb urine dipstick MODERATE (A) NEGATIVE   Bilirubin Urine NEGATIVE NEGATIVE   Ketones, ur NEGATIVE NEGATIVE mg/dL   Protein, ur NEGATIVE NEGATIVE mg/dL   Nitrite NEGATIVE NEGATIVE   Leukocytes, UA NEGATIVE NEGATIVE   RBC / HPF 0-5 0 - 5 RBC/hpf   WBC, UA 0-5 0 - 5 WBC/hpf   Bacteria, UA NONE SEEN NONE SEEN   Squamous Epithelial / LPF 0-5 (A) NONE SEEN  Pregnancy, urine  Result Value Ref Range   Preg Test, Ur NEGATIVE NEGATIVE  D-dimer, quantitative (not at [facility])  Result Value Ref Range   D-Dimer, Quant 0.44 0.00 - 0.50 ug/mL-FEU  POCT i-Stat troponin I  Result Value Ref Range   Troponin i, poc 0.01 0.00 - 0.08 ng/mL   Comment 3           Dg Chest 2 View  Result Date: 03/17/2017 CLINICAL DATA:  41 y/o  F; chest pain. EXAM: CHEST  2 VIEW COMPARISON:  07/27/2010 chest radiograph. FINDINGS: Stable heart size and mediastinal contours are within normal limits. Both lungs are clear. The visualized skeletal structures are unremarkable. IMPRESSION: No acute pulmonary process identified. Electronically Signed   By: Mitzi Hansen M.D.   On: 03/17/2017 22:41    EKG  EKG Interpretation  Date/Time:  Saturday March 17 2017 22:00:46 EDT Ventricular Rate:  74 PR Interval:    QRS Duration: 82 QT Interval:  390 QTC  Calculation: 433 R Axis:   29 Text Interpretation:  Sinus rhythm Confirmed by Shian Goodnow ([id number]) on 03/17/2017 11:25:11 PM       Radiology Dg Chest 2 View  Result Date: 03/17/2017 CLINICAL DATA:  41 y/o  F; chest pain. EXAM: CHEST  2 VIEW COMPARISON:  07/27/2010 chest radiograph. FINDINGS: Stable heart size and mediastinal contours are within normal limits. Both lungs are clear. The visualized skeletal structures are unremarkable. IMPRESSION: No acute pulmonary process identified. Electronically Signed   By: Mitzi Hansen M.D.   On: 03/17/2017 22:41    Procedures Procedures (including critical care time)  Medications Ordered in ED Medications  naproxen (NAPROSYN) tablet 500 mg (500 mg Oral Given 03/17/17 2356)  acetaminophen (TYLENOL) tablet 1,000 mg (1,000 mg Oral Given 03/17/17 2356)  gi cocktail (Maalox,Lidocaine,Donnatal) (30 mLs Oral Given 03/17/17 2356)       Final Clinical Impressions(s) / ED Diagnoses   Low risk for PE with negative ddimer. Symptoms are with crying.  Patient will not say about what but denies, depression SI or HI.  Return for exeritonal chest pain Shortness of breath, swelling or the lips or tongue, chest pain, dyspnea on exertion, new weakness or numbness changes in vision or speech,  Inability to tolerate liquids or food, changes in voice cough, altered mental status or any concerns. No signs of systemic illness or infection. The patient is nontoxic-appearing on exam and vital signs are within normal limits.    I have reviewed the triage vital signs and the nursing notes. Pertinent labs &imaging results that were available during my care of the patient were reviewed by me and considered in my medical decision making (see chart for details).  After history, exam, and medical workup I feel the patient has been appropriately medically screened and is  safe for discharge home. Pertinent diagnoses were discussed with the patient. Patient was given  return precautions.     New Prescriptions New Prescriptions   No medications on file     Brodie Scovell, MD 03/18/17 [phone]

## 2017-03-24 ENCOUNTER — Inpatient Hospital Stay (HOSPITAL_COMMUNITY)
Admission: AD | Admit: 2017-03-24 | Discharge: 2017-03-24 | Disposition: A | Discharge status: Home / Self Care | Payer: BLUE CROSS/BLUE SHIELD | Source: Ambulatory Visit | Attending: Obstetrics & Gynecology | Admitting: Obstetrics & Gynecology

## 2017-03-24 ENCOUNTER — Encounter (HOSPITAL_COMMUNITY): Payer: Self-pay

## 2017-03-24 DIAGNOSIS — N926 Irregular menstruation, unspecified: Secondary | ICD-10-CM | POA: Diagnosis not present

## 2017-03-24 LAB — WET PREP, GENITAL
Clue Cells Wet Prep HPF POC: NONE SEEN
SPERM: NONE SEEN
Trich, Wet Prep: NONE SEEN
Yeast Wet Prep HPF POC: NONE SEEN

## 2017-03-24 NOTE — Discharge Instructions (Signed)

## 2017-03-24 NOTE — MAU Note (Addendum)
My period was to come next wk and started today. No pain or problems. Has IUD. Wants to be sure everything is ok

## 2017-03-24 NOTE — MAU Provider Note (Signed)
History     CSN: [id number] Arrival date and time: 03/24/17 2005  First Provider Initiated Contact with Patient 03/24/17 2245      Chief Complaint  Patient presents with  . early period    HPI: Kirsten Hoover is a 41 y.o. [id number] who presents to MAU d/t early menses. She reports she has an IUD for 4 years and menses has been regular since, but today menses started about 1 week early. She denies any other symptoms, but came in because her husband was worried about this. Denies any abnormal vaginal discharge, vaginal odor, itching, abdominal cramping or pain, fevers, chills, sweats, dysuria, nausea, vomiting, other GI or GU symptoms or other general symptoms.   Past obstetric history: OB History  Gravida Para Term Preterm AB Living  SAB TAB Ectopic Multiple Live Births  1       4    # Outcome Date GA Lbr Len/2nd Weight Sex Delivery Anes PTL Lv  5 Term 02/21/13 107w2d 06:37 / 00:13 7 lb 11.8 oz (3.51 kg) F Vag-Spont EPI  LIV  4 SAB 2013 100w0d            Birth Comments: Passed naturally;no complications  3 Term 08/27/05 [redacted]w[redacted]d  6 lb (2.722 kg) F Vag-Spont None  LIV     Birth Comments: No complications  2 Term 09/25/00 [redacted]w[redacted]d  6 lb (2.722 kg) M Vag-Spont None  LIV     Birth Comments: No complications  1 Term 06/10/99 [redacted]w[redacted]d  6 lb (2.722 kg) F Vag-Spont None  LIV     Birth Comments: No complications      Past Medical History:  Diagnosis Date  . Advanced maternal age (AMA) in pregnancy 07/29/2012  . AMA (advanced maternal age) multigravida 35+   . Hyperemesis arising during pregnancy 07/16/2012  . Prolonged rupture of membranes, greater than 24 hours, delivered 02/23/2013   26 hours   . SAB (spontaneous abortion) October 2013 03/29/2012  . Sciatica   . Sickle cell trait (HCC)   . UTI (urinary tract infection) 02/21/2013  . Vaginal delivery 02/22/2013  . Vaginal irritation 02/21/2013    Past Surgical History:  Procedure Laterality Date  . KIDNEY DONATION    . NO PAST  SURGERIES      Family History  Problem Relation Age of Onset  . Hypertension Mother   . Hypertension Father   . Other Neg Hx     Social History  Substance Use Topics  . Smoking status: Never Smoker  . Smokeless tobacco: Never Used  . Alcohol use No    Allergies: No Known Allergies  Prescriptions Prior to Admission  Medication Sig Dispense Refill Last Dose  . ibuprofen (ADVIL,MOTRIN) 200 MG tablet Take 400 mg by mouth every 6 (six) hours as needed for headache, mild pain or moderate pain.   03/17/2017 at Unknown time  . naproxen (NAPROSYN) 375 MG tablet Take 1 tablet (375 mg total) by mouth 2 (two) times daily. 20 tablet 0   . ondansetron (ZOFRAN) 4 MG tablet Take 1 tablet (4 mg total) by mouth every 6 (six) hours. (Patient not taking: Reported on 03/18/2017) 12 tablet 0 Completed Course at Unknown time  . pantoprazole (PROTONIX) 40 MG tablet Take 1 tablet (40 mg total) by mouth daily. (Patient not taking: Reported on 12/31/2015) 30 tablet 3 Completed Course at Unknown time  . phenazopyridine (PYRIDIUM) 200 MG tablet Take 1 tablet (200 mg total) by mouth  3 (three) times daily as needed. (Patient not taking: Reported on 08/19/2016) 6 tablet 0 Completed Course at Unknown time    Review of Systems - Negative except for what is mentioned in HPI.  Physical Exam   Blood pressure 135/83, pulse 72, temperature 97.7 F (36.5 C), resp. rate 18, height  (1.854 m), weight 230 lb (104.3 kg), last menstrual period 03/24/2017.  Constitutional: Well-developed, well-nourished female in no acute distress.  HENT: [place]/AT, normal oropharynx mucosa Eyes: normal conjunctivae, no scleral icterus Cardiovascular: normal rate Respiratory: normal effort, no respiratory distress GI: Abd soft, non-tender, non-distended Pelvic: NEFG, no discharge, scant blood, cervix clean, IUD strings visualized. No CMT, no uterine tenderness, no adnexal tenderness of palpable masses MSK: Extremities nontender, no edema,  normal ROM Neurologic: Alert and oriented x 4. Psych: Normal mood and affect Skin: warm and dry    MAU Course  Procedures  MDM Pt seen and examined. Pelvic exam reassuring. Cultures collected.   Assessment and Plan  Assessment: 1. Irregular menses     Plan: --Reassurance provided. --Vaginal cultures collected.  --Discharge home in stable condition. Discussed return precautions.   Degele, Kandra Nicolas, MD 03/24/2017 11:06 PM

## 2017-03-24 NOTE — Progress Notes (Addendum)
Presents to triage for early period. Husband worried so brought pt in. Pt has IUD for 4 years.   2255: Provider at bs assessing. wetprep and GC done.    2307: Discharge instructions given with pt understanding. Pt left unit via ambulatory.

## 2017-03-26 LAB — GC/CHLAMYDIA PROBE AMP (~~LOC~~) NOT AT ARMC
CHLAMYDIA, DNA PROBE: NEGATIVE
NEISSERIA GONORRHEA: NEGATIVE

## 2019-05-25 ENCOUNTER — Encounter (HOSPITAL_COMMUNITY): Payer: Self-pay | Admitting: Emergency Medicine

## 2019-05-25 ENCOUNTER — Other Ambulatory Visit: Payer: Self-pay

## 2019-05-25 ENCOUNTER — Emergency Department (HOSPITAL_COMMUNITY): Payer: BLUE CROSS/BLUE SHIELD

## 2019-05-25 ENCOUNTER — Emergency Department (HOSPITAL_COMMUNITY)
Admission: EM | Admit: 2019-05-25 | Discharge: 2019-05-25 | Disposition: A | Discharge status: Home / Self Care | Payer: BLUE CROSS/BLUE SHIELD | Attending: Emergency Medicine | Admitting: Emergency Medicine

## 2019-05-25 DIAGNOSIS — R079 Chest pain, unspecified: Secondary | ICD-10-CM | POA: Diagnosis present

## 2019-05-25 DIAGNOSIS — Z20828 Contact with and (suspected) exposure to other viral communicable diseases: Secondary | ICD-10-CM | POA: Insufficient documentation

## 2019-05-25 LAB — BASIC METABOLIC PANEL
Anion gap: 11 (ref 5–15)
BUN: 8 mg/dL (ref 6–20)
CO2: 22 mmol/L (ref 22–32)
Calcium: 9.5 mg/dL (ref 8.9–10.3)
Chloride: 105 mmol/L (ref 98–111)
Creatinine, Ser: 0.8 mg/dL (ref 0.44–1.00)
GFR calc Af Amer: >60 mL/min (ref >60)
GFR calc non Af Amer: >60 mL/min (ref >60)
Glucose, Bld: 90 mg/dL (ref 70–99)
Potassium: 3.9 mmol/L (ref 3.5–5.1)
Sodium: 138 mmol/L (ref 135–145)

## 2019-05-25 LAB — CBC
HCT: 37.3 % (ref 36.0–46.0)
Hemoglobin: 11.9 g/dL — ABNORMAL LOW (ref 12.0–15.0)
MCH: 26.6 pg (ref 26.0–34.0)
MCHC: 31.9 g/dL (ref 30.0–36.0)
MCV: 83.3 fL (ref 80.0–100.0)
Platelets: 285 10*3/uL (ref 150–400)
RBC: 4.48 MIL/uL (ref 3.87–5.11)
RDW: 13 % (ref 11.5–15.5)
WBC: 4.2 10*3/uL (ref 4.0–10.5)
nRBC: 0 % (ref 0.0–0.2)

## 2019-05-25 LAB — I-STAT BETA HCG BLOOD, ED (MC, WL, AP ONLY): I-stat hCG, quantitative: <5 m[IU]/mL (ref <5)

## 2019-05-25 LAB — TROPONIN I (HIGH SENSITIVITY): Troponin I (High Sensitivity): <2 ng/L (ref <18)

## 2019-05-25 MED ORDER — SUCRALFATE 1 G PO TABS: 1.0000 g | ORAL_TABLET | Freq: Four times a day (QID) | ORAL | 0 refills | Status: AC | PRN

## 2019-05-25 MED ORDER — SODIUM CHLORIDE 0.9% FLUSH: 3.0000 mL | Freq: Once | INTRAVENOUS | Status: DC

## 2019-05-25 MED ORDER — ALUM & MAG HYDROXIDE-SIMETH 200-200-20 MG/5ML PO SUSP
30.0000 mL | Freq: Once | ORAL | Status: AC
  Administered 2019-05-25: 17:00:00 30 mL via ORAL
  Filled 2019-05-25: qty 30

## 2019-05-25 MED ORDER — LIDOCAINE VISCOUS HCL 2 % MT SOLN
15.0000 mL | Freq: Once | OROMUCOSAL | Status: AC
  Administered 2019-05-25: 17:00:00 15 mL via ORAL
  Filled 2019-05-25: qty 15

## 2019-05-25 NOTE — ED Notes (Signed)
Pt verbalized understanding of discharge instructions. Prescriptions reviewed, pt had no further questions at this time. 

## 2019-05-25 NOTE — ED Provider Notes (Signed)
[facility] Emergency Department Provider Note MRN:  [id number]  Arrival date & time: 05/25/19     Chief Complaint   Chest Pain   History of Present Illness   Kirsten Hoover is a 43 y.o. year-old female with no pertinent past medical history presenting to the ED with chief complaint of chest pain.  Location: Central chest Duration: 3 days Onset: Sudden Timing: Constant Description: Burning Severity: Moderate Exacerbating/Alleviating Factors: None Associated Symptoms: None Pertinent Negatives: Denies dizziness, no diaphoresis, no nausea, no vomiting, no trouble breathing.  No abdominal pain, no dysuria.   Review of Systems  A complete 10 system review of systems was obtained and all systems are negative except as noted in the HPI and PMH.   Patient's Health History    Past Medical History:  Diagnosis Date  . Advanced maternal age (AMA) in pregnancy 07/29/2012  . AMA (advanced maternal age) multigravida 54+   . Hyperemesis arising during pregnancy 07/16/2012  . Prolonged rupture of membranes, greater than 24 hours, delivered 02/23/2013   26 hours   . SAB (spontaneous abortion) October 2013 03/29/2012  . Sciatica   . Sickle cell trait ([place])   . UTI (urinary tract infection) 02/21/2013  . Vaginal delivery 02/22/2013  . Vaginal irritation 02/21/2013    Past Surgical History:  Procedure Laterality Date  . KIDNEY DONATION    . NO PAST SURGERIES      Family History  Problem Relation Age of Onset  . Hypertension Mother   . Hypertension Father   . Other Neg Hx     Social History   Socioeconomic History  . Marital status: Married    Spouse name: Fatehalrhman Abdalrhim  . Number of children: 3  . Years of education: 101  . Highest education level: Not on file  Occupational History  . Occupation: INSPECTION    Employer: Teacher, early years/pre  Social Needs  . Financial resource strain: Not on file  . Food insecurity    Worry: Not on file   Inability: Not on file  . Transportation needs    Medical: Not on file    Non-medical: Not on file  Tobacco Use  . Smoking status: Never Smoker  . Smokeless tobacco: Never Used  Substance and Sexual Activity  . Alcohol use: No  . Drug use: No  . Sexual activity: Yes    Birth control/protection: None  Lifestyle  . Physical activity    Days per week: Not on file    Minutes per session: Not on file  . Stress: Not on file  Relationships  . Social Herbalist on phone: Not on file    Gets together: Not on file    Attends religious service: Not on file    Active member of club or organization: Not on file    Attends meetings of clubs or organizations: Not on file    Relationship status: Not on file  . Intimate partner violence    Fear of current or ex partner: Not on file    Emotionally abused: Not on file    Physically abused: Not on file    Forced sexual activity: Not on file  Other Topics Concern  . Not on file  Social History Narrative  . Not on file     Physical Exam  Vital Signs and Nursing Notes reviewed Vitals:   05/25/19 1453  BP: 129/86  Pulse: 65  Resp: 16  Temp: 98.4 F (36.9 C)  SpO2:  100%    CONSTITUTIONAL: Well-appearing, NAD NEURO:  Alert and oriented x 3, no focal deficits EYES:  eyes equal and reactive ENT/NECK:  no LAD, no JVD CARDIO: Regular rate, well-perfused, normal S1 and S2 PULM:  CTAB no wheezing or rhonchi GI/GU:  normal bowel sounds, non-distended, non-tender MSK/SPINE:  No gross deformities, no edema SKIN:  no rash, atraumatic PSYCH:  Appropriate speech and behavior  Diagnostic and Interventional Summary    EKG Interpretation  Date/Time:  "Sunday May 25 2019 15:28:38 EST Ventricular Rate:  70 PR Interval:  172 QRS Duration: 80 QT Interval:  412 QTC Calculation: 444 R Axis:   13 Text Interpretation: Normal sinus rhythm Normal ECG No significant change was found Confirmed by Lilli Dewald ([id number]) on 05/25/2019  3:55:09 PM      Labs Reviewed  CBC - Abnormal; Notable for the following components:      Result Value   Hemoglobin 11.9 (*)    All other components within normal limits  NOVEL CORONAVIRUS, NAA (HOSP ORDER, SEND-OUT TO REF LAB; TAT 18-24 HRS)  BASIC METABOLIC PANEL  I-STAT BETA HCG BLOOD, ED ([facility], [facility], [facility] ONLY)  TROPONIN I (HIGH SENSITIVITY)  TROPONIN I (HIGH SENSITIVITY)    DG Chest 2 View  Final Result      Medications  sodium chloride flush (NS) 0.9 % injection 3 mL (3 mLs Intravenous Not Given 05/25/19 1638)  alum & mag hydroxide-simeth (MAALOX/MYLANTA) 200-200-20 MG/5ML suspension 30 mL (30 mLs Oral Given 05/25/19 1646)    And  lidocaine (XYLOCAINE) 2 % viscous mouth solution 15 mL (15 mLs Oral Given 05/25/19 1644)     Procedures  /  Critical Care Procedures  ED Course and Medical Decision Making  I have reviewed the triage vital signs and the nursing notes.  Pertinent labs & imaging results that were available during my care of the patient were reviewed by me and considered in my medical decision making (see below for details).     Suspect gastritis or GERD as the etiology of patient's burning chest pain.  PERC negative.  Little to no cardiac risk factors.  EKG reassuring, troponin negative, patient's pain resolved after GI cocktail.  Appropriate for discharge.  Prior to discharge patient requesting coronavirus test.  Now mentioning that she is having some URI symptoms.  No increased work of breathing, no hypoxia, appropriate for home quarantine.    Monseratt Ledin M. Kemal Amores, MD [place] [facility] [email or web address]  Final Clinical Impressions(s) / ED Diagnoses     ICD-10-CM   1. Chest pain, unspecified type  R07.9     ED Discharge Orders         Ordered    sucralfate (CARAFATE) 1 g tablet  4 times daily PRN     12" /06/20 1801           Discharge Instructions Discussed with and Provided to Patient:     Discharge  Instructions     You were evaluated in the Emergency Department and after careful evaluation, we did not find any emergent condition requiring admission or further testing in the hospital.  Your exam/testing today is overall reassuring.  The pain in your chest seems to be due to acid reflux.  You can take the Carafate medication up to 4 times daily to help with your symptoms.  Please return to the Emergency Department if you experience any worsening of your condition.  We encourage you to follow up with a primary care provider.  Thank you for allowing [place] to be a part of your care.      Sabas Sous, MD 05/25/19 [phone]

## 2019-05-25 NOTE — Discharge Instructions (Addendum)
You were evaluated in the Emergency Department and after careful evaluation, we did not find any emergent condition requiring admission or further testing in the hospital.  Your exam/testing today is overall reassuring.  The pain in your chest seems to be due to acid reflux.  You can take the Carafate medication up to 4 times daily to help with your symptoms.  Please return to the Emergency Department if you experience any worsening of your condition.  We encourage you to follow up with a primary care provider.  Thank you for allowing [place] to be a part of your care.

## 2019-05-25 NOTE — ED Triage Notes (Signed)
C/o chest pain to center of chest that radiates to L shoulder and L arm.  Reports feeling of heartburn, headache, and nausea.  Denies SOB.

## 2019-05-26 LAB — NOVEL CORONAVIRUS, NAA (HOSP ORDER, SEND-OUT TO REF LAB; TAT 18-24 HRS): SARS-CoV-2, NAA: DETECTED — AB

## 2019-05-28 ENCOUNTER — Telehealth (HOSPITAL_COMMUNITY): Payer: Self-pay

## 2019-05-29 ENCOUNTER — Telehealth (HOSPITAL_COMMUNITY): Payer: Self-pay

## 2019-06-03 ENCOUNTER — Emergency Department (HOSPITAL_COMMUNITY)
Admission: EM | Admit: 2019-06-03 | Discharge: 2019-06-04 | Disposition: A | Discharge status: Home / Self Care | Payer: BLUE CROSS/BLUE SHIELD | Attending: Emergency Medicine | Admitting: Emergency Medicine

## 2019-06-03 ENCOUNTER — Emergency Department (HOSPITAL_COMMUNITY): Payer: BLUE CROSS/BLUE SHIELD

## 2019-06-03 ENCOUNTER — Other Ambulatory Visit: Payer: Self-pay

## 2019-06-03 DIAGNOSIS — U071 COVID-19: Secondary | ICD-10-CM | POA: Insufficient documentation

## 2019-06-03 DIAGNOSIS — R110 Nausea: Secondary | ICD-10-CM

## 2019-06-03 DIAGNOSIS — R05 Cough: Secondary | ICD-10-CM | POA: Diagnosis present

## 2019-06-03 DIAGNOSIS — R5383 Other fatigue: Secondary | ICD-10-CM | POA: Insufficient documentation

## 2019-06-03 DIAGNOSIS — K219 Gastro-esophageal reflux disease without esophagitis: Secondary | ICD-10-CM

## 2019-06-03 LAB — COMPREHENSIVE METABOLIC PANEL
ALT: 15 U/L (ref 0–44)
AST: 16 U/L (ref 15–41)
Albumin: 3.1 g/dL — ABNORMAL LOW (ref 3.5–5.0)
Alkaline Phosphatase: 45 U/L (ref 38–126)
Anion gap: 8 (ref 5–15)
BUN: 9 mg/dL (ref 6–20)
CO2: 20 mmol/L — ABNORMAL LOW (ref 22–32)
Calcium: 8 mg/dL — ABNORMAL LOW (ref 8.9–10.3)
Chloride: 110 mmol/L (ref 98–111)
Creatinine, Ser: 0.67 mg/dL (ref 0.44–1.00)
GFR calc Af Amer: >60 mL/min (ref >60)
GFR calc non Af Amer: >60 mL/min (ref >60)
Glucose, Bld: 96 mg/dL (ref 70–99)
Potassium: 3.9 mmol/L (ref 3.5–5.1)
Sodium: 138 mmol/L (ref 135–145)
Total Bilirubin: 0.3 mg/dL (ref 0.3–1.2)
Total Protein: 6.1 g/dL — ABNORMAL LOW (ref 6.5–8.1)

## 2019-06-03 LAB — URINALYSIS, ROUTINE W REFLEX MICROSCOPIC
Bacteria, UA: NONE SEEN
Bilirubin Urine: NEGATIVE
Glucose, UA: NEGATIVE mg/dL
Ketones, ur: NEGATIVE mg/dL
Nitrite: NEGATIVE
Protein, ur: NEGATIVE mg/dL
Specific Gravity, Urine: 1.009 (ref 1.005–1.030)
pH: 5 (ref 5.0–8.0)

## 2019-06-03 LAB — CBC WITH DIFFERENTIAL/PLATELET
Abs Immature Granulocytes: 0.02 10*3/uL (ref 0.00–0.07)
Basophils Absolute: 0.1 10*3/uL (ref 0.0–0.1)
Basophils Relative: 1 %
Eosinophils Absolute: 0.2 10*3/uL (ref 0.0–0.5)
Eosinophils Relative: 2 %
HCT: 44 % (ref 36.0–46.0)
Hemoglobin: 13.6 g/dL (ref 12.0–15.0)
Immature Granulocytes: 0 %
Lymphocytes Relative: 45 %
Lymphs Abs: 3.7 10*3/uL (ref 0.7–4.0)
MCH: 26.1 pg (ref 26.0–34.0)
MCHC: 30.9 g/dL (ref 30.0–36.0)
MCV: 84.5 fL (ref 80.0–100.0)
Monocytes Absolute: 0.7 10*3/uL (ref 0.1–1.0)
Monocytes Relative: 9 %
Neutro Abs: 3.5 10*3/uL (ref 1.7–7.7)
Neutrophils Relative %: 43 %
Platelets: 303 10*3/uL (ref 150–400)
RBC: 5.21 MIL/uL — ABNORMAL HIGH (ref 3.87–5.11)
RDW: 12.8 % (ref 11.5–15.5)
WBC: 8.1 10*3/uL (ref 4.0–10.5)
nRBC: 0 % (ref 0.0–0.2)

## 2019-06-03 LAB — LACTIC ACID, PLASMA
Lactic Acid, Venous: 2.8 mmol/L (ref 0.5–1.9)
Lactic Acid, Venous: 1.3 mmol/L (ref 0.5–1.9)

## 2019-06-03 LAB — I-STAT BETA HCG BLOOD, ED (MC, WL, AP ONLY): I-stat hCG, quantitative: <5 m[IU]/mL (ref <5)

## 2019-06-03 MED ORDER — BENZONATATE 100 MG PO CAPS: 100.0000 mg | ORAL_CAPSULE | Freq: Three times a day (TID) | ORAL | 0 refills | Status: DC

## 2019-06-03 MED ORDER — PANTOPRAZOLE SODIUM 20 MG PO TBEC
20.0000 mg | DELAYED_RELEASE_TABLET | Freq: Once | ORAL | Status: AC
  Administered 2019-06-03: 21:00:00 20 mg via ORAL
  Filled 2019-06-03 (×2): qty 1

## 2019-06-03 MED ORDER — ACETAMINOPHEN 500 MG PO TABS: 500.0000 mg | ORAL_TABLET | Freq: Four times a day (QID) | ORAL | 0 refills | Status: AC | PRN

## 2019-06-03 MED ORDER — SODIUM CHLORIDE 0.9 % IV BOLUS
1000.0000 mL | Freq: Once | INTRAVENOUS | Status: AC
  Administered 2019-06-03: 1000 mL via INTRAVENOUS

## 2019-06-03 MED ORDER — KETOROLAC TROMETHAMINE 30 MG/ML IJ SOLN
15.0000 mg | Freq: Once | INTRAMUSCULAR | Status: AC
  Administered 2019-06-03: 15 mg via INTRAVENOUS
  Filled 2019-06-03: qty 1

## 2019-06-03 MED ORDER — ONDANSETRON HCL 4 MG PO TABS: 4.0000 mg | ORAL_TABLET | Freq: Four times a day (QID) | ORAL | 0 refills | Status: DC

## 2019-06-03 MED ORDER — SODIUM CHLORIDE 0.9% FLUSH
3.0000 mL | Freq: Once | INTRAVENOUS | Status: AC
  Administered 2019-06-03: 17:00:00 3 mL via INTRAVENOUS

## 2019-06-03 MED ORDER — ONDANSETRON HCL 4 MG/2ML IJ SOLN
4.0000 mg | Freq: Once | INTRAMUSCULAR | Status: AC
  Administered 2019-06-03: 4 mg via INTRAVENOUS
  Filled 2019-06-03: qty 2

## 2019-06-03 MED ORDER — PANTOPRAZOLE SODIUM 40 MG PO TBEC: 40.0000 mg | DELAYED_RELEASE_TABLET | Freq: Every day | ORAL | 0 refills | Status: AC

## 2019-06-03 MED ORDER — SODIUM CHLORIDE 0.9 % IV BOLUS
500.0000 mL | Freq: Once | INTRAVENOUS | Status: AC
  Administered 2019-06-03: 18:00:00 500 mL via INTRAVENOUS

## 2019-06-03 NOTE — Discharge Instructions (Signed)
Take Zofran every 6 hours as needed for nausea. Take Tylenol as prescribed, as needed for pain and body aches.  You can resume taking Protonix as prescribed.  Make sure to stay well-hydrated and drink plenty of fluids. Your urine will be sent for culture and if an antibiotic is required, you will be called about this. Please return to the emergency department if you develop any new or worsening symptoms including severe shortness of breath, intractable vomiting, localized abdominal pain, or any other concerning symptoms.

## 2019-06-03 NOTE — ED Provider Notes (Signed)
[facility] EMERGENCY DEPARTMENT Provider Note   CSN: [id number] Arrival date & time: 06/03/19  1408     History Chief Complaint  Patient presents with  . COVID  . Cough    Kirsten Hoover is a 43 y.o. female with history of sickle cell trait, single kidney who presents with a 10-day history of cough, back pain, nausea, fatigue that is progressively worsening after being diagnosed with Covid 10 days ago.  Patient is no longer having fever.  She denies any shortness of breath.  She has had chest pain only with cough.  She has had productive cough with mucus.  She denies any abdominal pain or vomiting.  She reports her back muscles have been sore and hurting since beginning of the illness.  She has had some decreased urination and decreased appetite.  Patient has been taking ibuprofen at home with some relief.  HPI     Past Medical History:  Diagnosis Date  . Advanced maternal age (AMA) in pregnancy 07/29/2012  . AMA (advanced maternal age) multigravida 35+   . Hyperemesis arising during pregnancy 07/16/2012  . Prolonged rupture of membranes, greater than 24 hours, delivered 02/23/2013   26 hours   . SAB (spontaneous abortion) October 2013 03/29/2012  . Sciatica   . Sickle cell trait (HCC)   . UTI (urinary tract infection) 02/21/2013  . Vaginal delivery 02/22/2013  . Vaginal irritation 02/21/2013    Patient Active Problem List   Diagnosis Date Noted  . Single kidney 10/18/2015  . Cystitis 10/18/2015  . 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 Plantar fasciitis, bilateral 02/23/2014  . Sciatica 02/21/2013  . Right axillary mass 07/16/2012  . Sickle cell trait (HCC) 07/01/2012    Past Surgical History:  Procedure Laterality Date  . KIDNEY DONATION    . NO PAST SURGERIES       OB History    Gravida  5   Para  4   Term  4   Preterm      AB  1   Living  4     SAB  1   TAB      Ectopic      Multiple      Live Births  4            Family History  Problem Relation Age of Onset  . Hypertension Mother   . Hypertension Father   . Other Neg Hx     Social History   Tobacco Use  . Smoking status: Never Smoker  . Smokeless tobacco: Never Used  Substance Use Topics  . Alcohol use: No  . Drug use: No    Home Medications Prior to Admission medications   Medication Sig Start Date End Date Taking? Authorizing Provider  acetaminophen (TYLENOL) 500 MG tablet Take 1 tablet (500 mg total) by mouth every 6 (six) hours as needed. 06/03/19   Tasnia Spegal, Waylan Boga, PA-C  ibuprofen (ADVIL,MOTRIN) 200 MG tablet Take 400 mg by mouth every 6 (six) hours as needed for headache, mild pain or moderate pain.    [provider]  ondansetron (ZOFRAN) 4 MG tablet Take 1 tablet (4 mg total) by mouth every 6 (six) hours. 06/03/19   Kealan Buchan, Waylan Boga, PA-C  pantoprazole (PROTONIX) 40 MG tablet Take 1 tablet (40 mg total) by mouth daily. 06/03/19   Emi Holes, PA-C  paragard intrauterine copper IUD IUD 1 Intra Uterine Device by Intrauterine route once.  [provider]  phenazopyridine (PYRIDIUM) 200 MG tablet Take 1 tablet (200 mg total) by mouth 3 (three) times daily as needed. Patient not taking: Reported on 08/19/2016 10/18/15   Peyton NajjarHopper, David H, MD  sucralfate (CARAFATE) 1 g tablet Take 1 tablet (1 g total) by mouth 4 (four) times daily as needed. 05/25/19   Sabas SousBero, Michael M, MD    Allergies    Patient has no known allergies.  Review of Systems   Review of Systems  Constitutional: Positive for fatigue. Negative for chills and fever.  HENT: Negative for facial swelling and sore throat.   Respiratory: Positive for cough. Negative for shortness of breath.   Cardiovascular: Positive for chest pain (with cough only).  Gastrointestinal: Negative for abdominal pain, nausea and vomiting.  Genitourinary: Negative for dysuria.  Musculoskeletal: Positive for back pain and myalgias.  Skin: Negative for rash and  wound.  Neurological: Positive for weakness (generalized). Negative for headaches.  Psychiatric/Behavioral: The patient is not nervous/anxious.     Physical Exam Updated Vital Signs BP 133/78   Pulse 65   Temp 99.7 F (37.6 C) (Oral)   Resp (!) 21   Ht 6\' 1"  (1.854 m)   Wt 95.3 kg   LMP 05/15/2019   SpO2 100%   BMI 27.71 kg/m   Physical Exam Vitals and nursing note reviewed.  Constitutional:      General: She is not in acute distress.    Appearance: She is well-developed. She is not diaphoretic.  HENT:     Head: Normocephalic and atraumatic.     Mouth/Throat:     Pharynx: No oropharyngeal exudate or posterior oropharyngeal erythema.  Eyes:     General: No scleral icterus.       Right eye: No discharge.        Left eye: No discharge.     Conjunctiva/sclera: Conjunctivae normal.     Pupils: Pupils are equal, round, and reactive to light.  Neck:     Thyroid: No thyromegaly.  Cardiovascular:     Rate and Rhythm: Normal rate and regular rhythm.     Heart sounds: Normal heart sounds. No murmur. No friction rub. No gallop.   Pulmonary:     Effort: Pulmonary effort is normal. No respiratory distress.     Breath sounds: Normal breath sounds. No stridor. No wheezing or rales.  Abdominal:     General: Bowel sounds are normal. There is no distension.     Palpations: Abdomen is soft.     Tenderness: There is no abdominal tenderness. There is no right CVA tenderness, left CVA tenderness, guarding or rebound.  Musculoskeletal:     Cervical back: Normal range of motion and neck supple.     Comments: No midline cervical, thoracic, or lumbar tenderness; no significant tenderness over the back on palpation  Lymphadenopathy:     Cervical: No cervical adenopathy.  Skin:    General: Skin is warm and dry.     Coloration: Skin is not pale.     Findings: No rash.  Neurological:     Mental Status: She is alert.     Coordination: Coordination normal.     ED Results / Procedures /  Treatments   Labs (all labs ordered are listed, but only abnormal results are displayed) Labs Reviewed  LACTIC ACID, PLASMA - Abnormal; Notable for the following components:      Result Value   Lactic Acid, Venous 2.8 (*)    All other components within normal limits  URINALYSIS, ROUTINE W REFLEX MICROSCOPIC - Abnormal; Notable for the following components:   Hgb urine dipstick SMALL (*)    Leukocytes,Ua TRACE (*)    All other components within normal limits  CBC WITH DIFFERENTIAL/PLATELET - Abnormal; Notable for the following components:   RBC 5.21 (*)    All other components within normal limits  COMPREHENSIVE METABOLIC PANEL - Abnormal; Notable for the following components:   CO2 20 (*)    Calcium 8.0 (*)    Total Protein 6.1 (*)    Albumin 3.1 (*)    All other components within normal limits  URINE CULTURE  LACTIC ACID, PLASMA  CBC WITH DIFFERENTIAL/PLATELET  I-STAT BETA HCG BLOOD, ED ([facility], [facility], [facility] ONLY)    EKG EKG Interpretation  Date/Time:  Tuesday June 03 2019 15:37:29 EST Ventricular Rate:  65 PR Interval:    QRS Duration: 87 QT Interval:  402 QTC Calculation: 418 R Axis:     Text Interpretation: Sinus rhythm No significant change since last tracing Confirmed by Linwood Dibbles [phone]) on 06/03/2019 6:01:33 PM   Radiology DG Chest Portable 1 View  Result Date: 06/03/2019 CLINICAL DATA:  COVID-19 positive.  Cough and fever EXAM: PORTABLE CHEST 1 VIEW COMPARISON:  May 25, 2019 FINDINGS: Lungs are clear. Heart size and pulmonary vascularity are normal. No adenopathy. No bone lesions. IMPRESSION: No edema or consolidation.  No evident adenopathy. Electronically Signed   By: Bretta Bang III M.D.   On: 06/03/2019 16:13    Procedures Procedures (including critical care time)  Medications Ordered in ED Medications  sodium chloride flush (NS) 0.9 % injection 3 mL (3 mLs Intravenous Given by Other 06/03/19 1714)  sodium chloride 0.9 % bolus 500 mL (  Intravenous Stopped 06/03/19 1902)  ondansetron (ZOFRAN) injection 4 mg (4 mg Intravenous Given 06/03/19 1733)  sodium chloride 0.9 % bolus 1,000 mL (0 mLs Intravenous Stopped 06/03/19 2116)  ketorolac (TORADOL) 30 MG/ML injection 15 mg (15 mg Intravenous Given 06/03/19 2129)  pantoprazole (PROTONIX) EC tablet 20 mg (20 mg Oral Given 06/03/19 2129)    ED Course  I have reviewed the triage vital signs and the nursing notes.  Pertinent labs & imaging results that were available during my care of the patient were reviewed by me and considered in my medical decision making (see chart for details).    MDM Rules/Calculators/A&P                      Patient with known COVID-19 infection presenting with ongoing cough, body aches, back pain, and nausea.  She reports decreased urination.  She is feeling much better after IV fluids, Zofran, and Toradol. Patient also requested her home Protonix as she was experiencing heartburn during her ED course.  She reports this feels like her typical heartburn.  Initially lactic acid ordered from triage was 2.8.  This cleared after IV fluids to 1.3.  Doubt sepsis.  Patient did not meet SIRS criteria.  Chest x-ray is clear.  UA shows small hematuria, which is baseline for the patient.  It also shows trace leukocytes and 6-10 squamous epithelial cells.  Will send for culture considering back pain, however unlikely UTI.  Would treat if positive, however. Patient is 10 days into COVID-19 infection, likely still symptomatic from this.    Patient ambulated at 95% on room air with only mild shortness of breath and dizziness.  Continue supportive treatment at home and will discharge home with Zofran.  Will refill home  Protonix.  Return precautions discussed.  Patient understands and agrees with plan.  Patient vitals stable and discharged in satisfactory condition.  Patient vitals stable throughout ED course and discharged in satisfactory condition.  Final Clinical Impression(s)  / ED Diagnoses Final diagnoses:  COVID-19 virus infection  Nausea    Rx / DC Orders ED Discharge Orders         Ordered    ondansetron (ZOFRAN) 4 MG tablet  Every 6 hours     06/03/19 2248    acetaminophen (TYLENOL) 500 MG tablet  Every 6 hours PRN     06/03/19 2248    pantoprazole (PROTONIX) 40 MG tablet  Daily     06/03/19 [address], PA-C 06/03/19 2253    Dorie Rank, MD 06/05/19 [phone]

## 2019-06-03 NOTE — ED Notes (Signed)
Pt ambulated in room with no assistance, O2 remained at 95% while ambulating and returned to 100% once returned to bed. Pt reports feeling a little SOB and dizziness.

## 2019-06-03 NOTE — ED Triage Notes (Signed)
Pt arrives via POV from home with cough, fever. Reports diagnosed with COVID last Saturday. C/o nausea and back pain. Denies vomiting. VSS. NAD at present.

## 2019-06-04 LAB — URINE CULTURE: Culture: <10000 — AB

## 2019-06-26 ENCOUNTER — Emergency Department (HOSPITAL_COMMUNITY): Payer: BC Managed Care – PPO

## 2019-06-26 ENCOUNTER — Emergency Department (HOSPITAL_COMMUNITY)
Admission: EM | Admit: 2019-06-26 | Discharge: 2019-06-26 | Disposition: A | Discharge status: Home / Self Care | Payer: BC Managed Care – PPO | Attending: Emergency Medicine | Admitting: Emergency Medicine

## 2019-06-26 DIAGNOSIS — U071 COVID-19: Secondary | ICD-10-CM | POA: Diagnosis not present

## 2019-06-26 DIAGNOSIS — R05 Cough: Secondary | ICD-10-CM | POA: Diagnosis not present

## 2019-06-26 DIAGNOSIS — R0602 Shortness of breath: Secondary | ICD-10-CM | POA: Diagnosis present

## 2019-06-26 DIAGNOSIS — R059 Cough, unspecified: Secondary | ICD-10-CM

## 2019-06-26 LAB — BASIC METABOLIC PANEL
Anion gap: 13 (ref 5–15)
BUN: 14 mg/dL (ref 6–20)
CO2: 23 mmol/L (ref 22–32)
Calcium: 9.6 mg/dL (ref 8.9–10.3)
Chloride: 102 mmol/L (ref 98–111)
Creatinine, Ser: 0.91 mg/dL (ref 0.44–1.00)
GFR calc Af Amer: >60 mL/min (ref >60)
GFR calc non Af Amer: >60 mL/min (ref >60)
Glucose, Bld: 128 mg/dL — ABNORMAL HIGH (ref 70–99)
Potassium: 4 mmol/L (ref 3.5–5.1)
Sodium: 138 mmol/L (ref 135–145)

## 2019-06-26 LAB — I-STAT BETA HCG BLOOD, ED (MC, WL, AP ONLY): I-stat hCG, quantitative: <5 m[IU]/mL (ref <5)

## 2019-06-26 LAB — HEPATIC FUNCTION PANEL
ALT: 13 U/L (ref 0–44)
AST: 16 U/L (ref 15–41)
Albumin: 3.4 g/dL — ABNORMAL LOW (ref 3.5–5.0)
Alkaline Phosphatase: 50 U/L (ref 38–126)
Bilirubin, Direct: <0.1 mg/dL (ref 0.0–0.2)
Total Bilirubin: 0.4 mg/dL (ref 0.3–1.2)
Total Protein: 6.9 g/dL (ref 6.5–8.1)

## 2019-06-26 LAB — URINALYSIS, ROUTINE W REFLEX MICROSCOPIC
Bilirubin Urine: NEGATIVE
Glucose, UA: NEGATIVE mg/dL
Hgb urine dipstick: NEGATIVE
Ketones, ur: NEGATIVE mg/dL
Nitrite: NEGATIVE
Protein, ur: NEGATIVE mg/dL
Specific Gravity, Urine: 1.013 (ref 1.005–1.030)
pH: 6 (ref 5.0–8.0)

## 2019-06-26 LAB — CBC
HCT: 40.6 % (ref 36.0–46.0)
Hemoglobin: 13.2 g/dL (ref 12.0–15.0)
MCH: 26.8 pg (ref 26.0–34.0)
MCHC: 32.5 g/dL (ref 30.0–36.0)
MCV: 82.4 fL (ref 80.0–100.0)
Platelets: 352 10*3/uL (ref 150–400)
RBC: 4.93 MIL/uL (ref 3.87–5.11)
RDW: 13 % (ref 11.5–15.5)
WBC: 7.7 10*3/uL (ref 4.0–10.5)
nRBC: 0 % (ref 0.0–0.2)

## 2019-06-26 LAB — LIPASE, BLOOD: Lipase: 17 U/L (ref 11–51)

## 2019-06-26 LAB — TROPONIN I (HIGH SENSITIVITY)
Troponin I (High Sensitivity): <2 ng/L (ref <18)
Troponin I (High Sensitivity): <2 ng/L (ref <18)

## 2019-06-26 MED ORDER — BENZONATATE 100 MG PO CAPS: 100.0000 mg | ORAL_CAPSULE | Freq: Three times a day (TID) | ORAL | 0 refills | Status: DC | PRN

## 2019-06-26 MED ORDER — BENZONATATE 100 MG PO CAPS: 100.0000 mg | ORAL_CAPSULE | Freq: Three times a day (TID) | ORAL | 0 refills | Status: AC

## 2019-06-26 MED ORDER — AZITHROMYCIN 250 MG PO TABS: ORAL_TABLET | ORAL | 0 refills | Status: AC

## 2019-06-26 MED ORDER — SODIUM CHLORIDE 0.9 % IV BOLUS
1000.0000 mL | Freq: Once | INTRAVENOUS | Status: AC
  Administered 2019-06-26: 1000 mL via INTRAVENOUS

## 2019-06-26 MED ORDER — IOHEXOL 350 MG/ML SOLN
100.0000 mL | Freq: Once | INTRAVENOUS | Status: AC
  Administered 2019-06-26: 100 mL via INTRAVENOUS

## 2019-06-26 MED ORDER — SODIUM CHLORIDE 0.9% FLUSH: 3.0000 mL | Freq: Once | INTRAVENOUS | Status: DC

## 2019-06-26 NOTE — ED Notes (Signed)
Pt SPO2 95-100% on RA while ambulating

## 2019-06-26 NOTE — ED Notes (Signed)
Patient ambulated to and from bathroom with a steady gait. 

## 2019-06-26 NOTE — ED Triage Notes (Signed)
To ED for eval of continued malaise after being dx with covid 12/6. Seen by pcp who wanted pt sent to ED for further eval. With letter from provider concerned about PE or pneumonia due to sob and pain with deep breath. Chills yesterday but denies fevers. Appears in nad.

## 2019-06-26 NOTE — Discharge Instructions (Addendum)
Return for any worsening symptoms.  Is important that you rest and drink plenty of fluids.  I prescribed you a Tessalon Perles medication for cough.  Please take this as needed.  Also take antibiotic for potential superimposed bacterial pneumonia.

## 2019-06-26 NOTE — ED Notes (Signed)
Pt speaks and understands english well

## 2019-06-26 NOTE — ED Notes (Signed)
Patient verbalizes understanding of discharge instructions. Opportunity for questioning and answers were provided. Armband removed by staff, pt discharged from ED ambulatory.   

## 2019-06-26 NOTE — ED Provider Notes (Signed)
[facility] EMERGENCY DEPARTMENT Provider Note   CSN: [id number] Arrival date & time: 06/26/19  0740    History Chief Complaint  Patient presents with  . Fatigue    Kirsten Hoover is a 44 y.o. female with medical history significant for sickle cell trait, known Covid infection who presents for evaluation of shortness of breath and pleuritic chest pain.  Patient diagnosed with Covid in December.  Since then she continues to have cough, fatigue as well as right-sided pleuritic chest pain.  She was seen by her PCP through telemedicine and they sent her here to the emergency department for chest pain work-up and a CT scan to rule out a PE.  Patient states occasionally when she takes a deep breath she will get a sharp pain under her right shoulder blade.  She has no prior history of PE or DVTs.  Patient states that she has not been very mobile since she was diagnosed with Covid.  She is able to tolerate p.o. intake at home without difficulty.  She denies headache, dizziness, lightheadedness, congestion, rhinorrhea, hemoptysis, abdominal pain, diarrhea, dysuria, unilateral weakness.  Denies unilateral leg swelling, redness or warmth.  She has not take anything for her symptoms.  She does admit to a mild scratchy sore throat however denies any drooling, dysphagia or trismus.  Denies additional aggravating or alleviating factors.  History obtained from patient and past medical records.  No interpreter was used.  HPI     Past Medical History:  Diagnosis Date  . Advanced maternal age (AMA) in pregnancy 07/29/2012  . AMA (advanced maternal age) multigravida 35+   . Hyperemesis arising during pregnancy 07/16/2012  . Prolonged rupture of membranes, greater than 24 hours, delivered 02/23/2013   26 hours   . SAB (spontaneous abortion) October 2013 03/29/2012  . Sciatica   . Sickle cell trait (HCC)   . UTI (urinary tract infection) 02/21/2013  . Vaginal delivery 02/22/2013  .  Vaginal irritation 02/21/2013    Patient Active Problem List   Diagnosis Date Noted  . Single kidney 10/18/2015  . Cystitis 10/18/2015  . 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 Plantar fasciitis, bilateral 02/23/2014  . Sciatica 02/21/2013  . Right axillary mass 07/16/2012  . Sickle cell trait (HCC) 07/01/2012    Past Surgical History:  Procedure Laterality Date  . KIDNEY DONATION    . NO PAST SURGERIES       OB History    Gravida  5   Para  4   Term  4   Preterm      AB  1   Living  4     SAB  1   TAB      Ectopic      Multiple      Live Births  4           Family History  Problem Relation Age of Onset  . Hypertension Mother   . Hypertension Father   . Other Neg Hx     Social History   Tobacco Use  . Smoking status: Never Smoker  . Smokeless tobacco: Never Used  Substance Use Topics  . Alcohol use: No  . Drug use: No    Home Medications Prior to Admission medications   Medication Sig Start Date End Date Taking? Authorizing Provider  acetaminophen (TYLENOL) 500 MG tablet Take 1 tablet (500 mg total) by mouth every 6 (six) hours as needed. Patient taking differently: Take 500  mg by mouth every 6 (six) hours as needed for mild pain.  06/03/19  Yes Law, Bea Graff, PA-C  Ascorbic Acid (VITAMIN C PO) Take 1 tablet by mouth daily.   Yes [provider]  neomycin-polymyxin-dexamethasone (MAXITROL) 0.1 % ophthalmic suspension 4 drops See admin instructions. Place 4 drops into both EARS 3 times for 10 days   Yes [provider]  Omega-3 Fatty Acids (FISH OIL) 1000 MG CAPS Take 1 capsule by mouth 2 (two) times daily.   Yes [provider]  pantoprazole (PROTONIX) 40 MG tablet Take 1 tablet (40 mg total) by mouth daily. 06/03/19  Yes Law, Bea Graff, PA-C  paragard intrauterine copper IUD IUD 1 Intra Uterine Device by Intrauterine route once.   Yes [provider]  benzonatate (TESSALON) 100 MG  capsule Take 1 capsule (100 mg total) by mouth every 8 (eight) hours. 06/26/19   Tynisa Vohs A, PA-C  phenazopyridine (PYRIDIUM) 200 MG tablet Take 1 tablet (200 mg total) by mouth 3 (three) times daily as needed. Patient not taking: Reported on 06/26/2019 10/18/15   Posey Boyer, MD  sucralfate (CARAFATE) 1 g tablet Take 1 tablet (1 g total) by mouth 4 (four) times daily as needed. Patient not taking: Reported on 06/26/2019 05/25/19   Maudie Flakes, MD    Allergies    Patient has no known allergies.  Review of Systems   Review of Systems  Constitutional: Positive for activity change and fatigue. Negative for appetite change, chills, diaphoresis, fever and unexpected weight change.  HENT: Positive for sore throat. Negative for congestion, postnasal drip, rhinorrhea, sinus pressure and sinus pain.   Respiratory: Positive for cough and shortness of breath (Intermittent).   Cardiovascular: Positive for chest pain (Pleuritic). Negative for palpitations and leg swelling.  Gastrointestinal: Negative.   Genitourinary: Negative.   Musculoskeletal: Negative.   Skin: Negative.   Neurological: Positive for weakness (Generalized).  All other systems reviewed and are negative.   Physical Exam Updated Vital Signs BP 102/67 (BP Location: Right Arm)   Pulse 74   Temp 98.1 F (36.7 C) (Oral)   Resp 19   LMP 06/12/2019 (Approximate)   SpO2 100%   Physical Exam Vitals and nursing note reviewed.  Constitutional:      General: She is not in acute distress.    Appearance: She is well-developed. She is not ill-appearing or toxic-appearing.  HENT:     Head: Normocephalic and atraumatic.     Nose: Nose normal.     Mouth/Throat:     Mouth: Mucous membranes are moist.     Pharynx: Oropharynx is clear.  Eyes:     Pupils: Pupils are equal, round, and reactive to light.  Cardiovascular:     Rate and Rhythm: Normal rate.     Pulses: Normal pulses.     Heart sounds: Normal heart sounds.    Pulmonary:     Effort: Pulmonary effort is normal. No respiratory distress.     Breath sounds: Normal breath sounds.  Abdominal:     General: Bowel sounds are normal. There is no distension.  Musculoskeletal:        General: Normal range of motion.     Cervical back: Normal range of motion.     Comments: Homans sign negative. Moves all 4 extremities without difficulty.  Skin:    General: Skin is warm and dry.     Capillary Refill: Capillary refill takes less than 2 seconds.     Comments: Brisk  cap refill. No edema, erythema, warmth  Neurological:     Mental Status: She is alert.     Comments: Ambulatory in room without difficulty     ED Results / Procedures / Treatments   Labs (all labs ordered are listed, but only abnormal results are displayed) Labs Reviewed  BASIC METABOLIC PANEL - Abnormal; Notable for the following components:      Result Value   Glucose, Bld 128 (*)    All other components within normal limits  URINALYSIS, ROUTINE W REFLEX MICROSCOPIC - Abnormal; Notable for the following components:   APPearance HAZY (*)    Leukocytes,Ua TRACE (*)    Bacteria, UA RARE (*)    All other components within normal limits  CBC  I-STAT BETA HCG BLOOD, ED ([facility], [facility], [facility] ONLY)  TROPONIN I (HIGH SENSITIVITY)  TROPONIN I (HIGH SENSITIVITY)    EKG EKG Interpretation  Date/Time:  Thursday June 26 2019 08:01:18 EST Ventricular Rate:  74 PR Interval:  164 QRS Duration: 76 QT Interval:  384 QTC Calculation: 426 R Axis:   19 Text Interpretation: Normal sinus rhythm Cannot rule out Anterior infarct , age undetermined Abnormal ECG Confirmed by Vanetta Mulders [phone]) on 06/26/2019 10:15:21 AM   Radiology DG Chest Portable 1 View  Result Date: 06/26/2019 CLINICAL DATA:  Coronavirus infection.  Shortness of breath. EXAM: PORTABLE CHEST 1 VIEW COMPARISON:  None. FINDINGS: The heart size and mediastinal contours are within normal limits. Both lungs are clear. The visualized  skeletal structures are unremarkable. IMPRESSION: No active disease. Electronically Signed   By: Paulina Fusi M.D.   On: 06/26/2019 16:02    Procedures Procedures (including critical care time)  Medications Ordered in ED Medications  sodium chloride flush (NS) 0.9 % injection 3 mL (has no administration in time range)  iohexol (OMNIPAQUE) 350 MG/ML injection 100 mL (has no administration in time range)  sodium chloride 0.9 % bolus 1,000 mL (1,000 mLs Intravenous New Bag/Given 06/26/19 1025)    ED Course  I have reviewed the triage vital signs and the nursing notes.  Pertinent labs & imaging results that were available during my care of the patient were reviewed by me and considered in my medical decision making (see chart for details).  44 year old presents for evaluation of fatigue and SOB. Sent from PCP for CTA chest to r/o PE. Afebrile, non septic non ill appearing., Heart and lungs clear. Abd soft, Homans sign negative, No DVT on exam, No tachycardia, tachypnea, hypoxia. Wells criteria low risk however given patient was sent specifically with a note from her PCP to have a CTA of chest, cardiac enzymes will order.  Her chest pain does not sound ACS related.  Pain is not exertional in nature, no states to diaphoresis, lightheadedness, dizziness, radiation to left arm, left jaw or back.  Low suspicion for dissection, myocarditis, pericarditis as cause of her symptoms.  1500: Inquired aabout delay with CT chest to rule out PE.  CT tech states there is an emergent procedure currently in the CT scanner and patient is next on the list after procedure is done.  Labs and imaging personally reviewed and interpreted:  Clinical Course as of Jun 25 1632  Thu Jun 26, 2019  1524 No leukocytosis, hemoglobin 13.2  CBC [BH]  1525 Hyperglycemia however no additional electrolyte, renal or liver abnormality  Basic metabolic panel(!) [BH]  1525 Negative pregnancy test  I-Stat beta hCG blood, ED [BH]    1525 Delta troponin negative  Troponin I (High Sensitivity) [BH]  1525 Trace leuks, rare bacteria however patient denies urinary symptoms, shared decision making will hold on antibiotics at this time.  Urinalysis, Routine w reflex microscopic(!) [BH]  1526 Normal sinus rhythm, no ST/T changes.  No tachycardia.  ED EKG [BH]  1612 No cardiopulmonary disease  DG Chest Portable 1 View [BH]    Clinical Course User Index [BH] Farrie Sann A, PA-C   1600: Care transferred to Laveda Norman, [place] who will follow up on CTA chest, ambulate with pulse ox. If negative can dc home with tessalon pearls, strict follow up.     MDM Rules/Calculators/A&P                      Kirsten Hoover was evaluated in Emergency Department on 06/26/2019 for the symptoms described in the history of present illness. She was evaluated in the context of the global COVID-19 pandemic, which necessitated consideration that the patient might be at risk for infection with the SARS-CoV-2 virus that causes COVID-19. Institutional protocols and algorithms that pertain to the evaluation of patients at risk for COVID-19 are in a state of rapid change based on information released by regulatory bodies including the CDC and federal and state organizations. These policies and algorithms were followed during the patient's care in the ED. Final Clinical Impression(s) / ED Diagnoses Final diagnoses:  COVID-19  Cough  SOB (shortness of breath)    Rx / DC Orders ED Discharge Orders         Ordered    benzonatate (TESSALON) 100 MG capsule  Every 8 hours     06/26/19 1528           Evolette Pendell A, PA-C 06/26/19 1635    Vanetta Mulders, MD 07/11/19 1528

## 2019-06-26 NOTE — ED Provider Notes (Signed)
2 weeks post covid here with SOB and pleuritic CP. Currently awaits chest CTA to r/o PE.  Anticipate d/c.   Patient reports she is concerned of potential gallbladder problem causing her pain.  She described pain as a sharp sensation that goes across upper abdomen into her chest sometimes brought on by spicy food or milk.  On exam she does have tenderness to her right upper quadrant on palpation.  Chest CT may have been ordered, if unremarkable, may consider limited abdominal ultrasound to rule out gallbladder etiology.  Will check function panel and lipase.  8:50 PM Normal hepatic function panel, normal lipase, normal delta troponin.  Chest CT angiogram showing patchy groundglass opacity within the right middle and lower lobe consistent with a given clinical history of COVID-19 positivity.  No evidence of PE.  Patient was reassured with this information.  I have low suspicion for gallbladder etiology causing her pain as the pneumonia is likely the contributor of her discomfort.  Given prolonged illness and possibility of superimposed bacterial infection, I will prescribe antibiotic at discharge.  Encourage patient to follow-up with PCP for further care.  Return precaution discussed.  No hypoxia.  Work note provided as requested.  Kirsten Hoover was evaluated in Emergency Department on 06/26/2019 for the symptoms described in the history of present illness. She was evaluated in the context of the global COVID-19 pandemic, which necessitated consideration that the patient might be at risk for infection with the SARS-CoV-2 virus that causes COVID-19. Institutional protocols and algorithms that pertain to the evaluation of patients at risk for COVID-19 are in a state of rapid change based on information released by regulatory bodies including the CDC and federal and state organizations. These policies and algorithms were followed during the patient's care in the ED.   BP 112/71   Pulse 67   Temp 98.1  F (36.7 C) (Oral)   Resp (!) 21   LMP 06/12/2019 (Approximate)   SpO2 100%   Results for orders placed or performed during the hospital encounter of 86/57/84  Basic metabolic panel  Result Value Ref Range   Sodium 138 135 - 145 mmol/L   Potassium 4.0 3.5 - 5.1 mmol/L   Chloride 102 98 - 111 mmol/L   CO2 23 22 - 32 mmol/L   Glucose, Bld 128 (H) 70 - 99 mg/dL   BUN 14 6 - 20 mg/dL   Creatinine, Ser 0.91 0.44 - 1.00 mg/dL   Calcium 9.6 8.9 - 10.3 mg/dL   GFR calc non Af Amer >60 >60 mL/min   GFR calc Af Amer >60 >60 mL/min   Anion gap 13 5 - 15  CBC  Result Value Ref Range   WBC 7.7 4.0 - 10.5 K/uL   RBC 4.93 3.87 - 5.11 MIL/uL   Hemoglobin 13.2 12.0 - 15.0 g/dL   HCT 40.6 36.0 - 46.0 %   MCV 82.4 80.0 - 100.0 fL   MCH 26.8 26.0 - 34.0 pg   MCHC 32.5 30.0 - 36.0 g/dL   RDW 13.0 11.5 - 15.5 %   Platelets 352 150 - 400 K/uL   nRBC 0.0 0.0 - 0.2 %  Urinalysis, Routine w reflex microscopic  Result Value Ref Range   Color, Urine YELLOW YELLOW   APPearance HAZY (A) CLEAR   Specific Gravity, Urine 1.013 1.005 - 1.030   pH 6.0 5.0 - 8.0   Glucose, UA NEGATIVE NEGATIVE mg/dL   Hgb urine dipstick NEGATIVE NEGATIVE   Bilirubin Urine NEGATIVE  NEGATIVE   Ketones, ur NEGATIVE NEGATIVE mg/dL   Protein, ur NEGATIVE NEGATIVE mg/dL   Nitrite NEGATIVE NEGATIVE   Leukocytes,Ua TRACE (A) NEGATIVE   RBC / HPF 0-5 0 - 5 RBC/hpf   WBC, UA 0-5 0 - 5 WBC/hpf   Bacteria, UA RARE (A) NONE SEEN   Squamous Epithelial / LPF 6-10 0 - 5  Hepatic function panel  Result Value Ref Range   Total Protein 6.9 6.5 - 8.1 g/dL   Albumin 3.4 (L) 3.5 - 5.0 g/dL   AST 16 15 - 41 U/L   ALT 13 0 - 44 U/L   Alkaline Phosphatase 50 38 - 126 U/L   Total Bilirubin 0.4 0.3 - 1.2 mg/dL   Bilirubin, Direct <0.9 0.0 - 0.2 mg/dL   Indirect Bilirubin NOT CALCULATED 0.3 - 0.9 mg/dL  Lipase  Result Value Ref Range   Lipase 17 11 - 51 U/L  I-Stat beta hCG blood, ED  Result Value Ref Range   I-stat hCG,  quantitative <5.0 <5 mIU/mL   Comment 3          Troponin I (High Sensitivity)  Result Value Ref Range   Troponin I (High Sensitivity) <2 <18 ng/L  Troponin I (High Sensitivity)  Result Value Ref Range   Troponin I (High Sensitivity) <2 <18 ng/L   CT Angio Chest PE W/Cm &/Or Wo Cm  Result Date: 06/26/2019 CLINICAL DATA:  Shortness of breath and fatigue, history of COVID-19 positivity EXAM: CT ANGIOGRAPHY CHEST WITH CONTRAST TECHNIQUE: Multidetector CT imaging of the chest was performed using the standard protocol during bolus administration of intravenous contrast. Multiplanar CT image reconstructions and MIPs were obtained to evaluate the vascular anatomy. CONTRAST:  75 mL Omnipaque 350. COMPARISON:  Chest x-ray from earlier in the same day. FINDINGS: Cardiovascular: Thoracic aorta demonstrates a variant branching pattern with the left vertebral artery arising directly from the aorta. No aneurysmal dilatation or dissection is seen. No significant atherosclerotic changes are noted. No cardiac enlargement is seen. No coronary calcifications are noted. The pulmonary artery shows a normal branching pattern without intraluminal filling defect to suggest pulmonary embolism. Mediastinum/Nodes: Thoracic inlet is within normal limits. No hilar or mediastinal adenopathy is noted. A few likely reactive hilar lymph nodes are seen. The esophagus as visualized is within normal limits. Lungs/Pleura: Lungs are well aerated bilaterally. Mild right middle lobe and right lower lobe ground-glass opacities are noted consistent with the given clinical history. No sizable effusion is seen. No parenchymal nodules are noted. Upper Abdomen: Within normal limits. Musculoskeletal: No acute bony abnormality is seen. Review of the MIP images confirms the above findings. IMPRESSION: Patchy ground-glass opacities within the right middle and lower lobes consistent with the given clinical history of COVID-19 positivity. No evidence of  pulmonary emboli. Electronically Signed   By: Alcide Clever M.D.   On: 06/26/2019 20:31   DG Chest Portable 1 View  Result Date: 06/26/2019 CLINICAL DATA:  Coronavirus infection.  Shortness of breath. EXAM: PORTABLE CHEST 1 VIEW COMPARISON:  None. FINDINGS: The heart size and mediastinal contours are within normal limits. Both lungs are clear. The visualized skeletal structures are unremarkable. IMPRESSION: No active disease. Electronically Signed   By: Paulina Fusi M.D.   On: 06/26/2019 16:02   DG Chest Portable 1 View  Result Date: 06/03/2019 CLINICAL DATA:  COVID-19 positive.  Cough and fever EXAM: PORTABLE CHEST 1 VIEW COMPARISON:  May 25, 2019 FINDINGS: Lungs are clear. Heart size and pulmonary vascularity are normal.  No adenopathy. No bone lesions. IMPRESSION: No edema or consolidation.  No evident adenopathy. Electronically Signed   By: Bretta Bang III M.D.   On: 06/03/2019 16:13      Fayrene Helper, PA-C 06/26/19 2055    Mancel Bale, MD 06/30/19 [phone]

## 2021-09-11 IMAGING — CR DG CHEST 2V
2 series · 2 of 2 positions shown · non-contrast
Comparison: Chest radiograph dated 03/18/2017

CLINICAL DATA: Chest pain

EXAM:
CHEST - 2 VIEW

[chest pa]
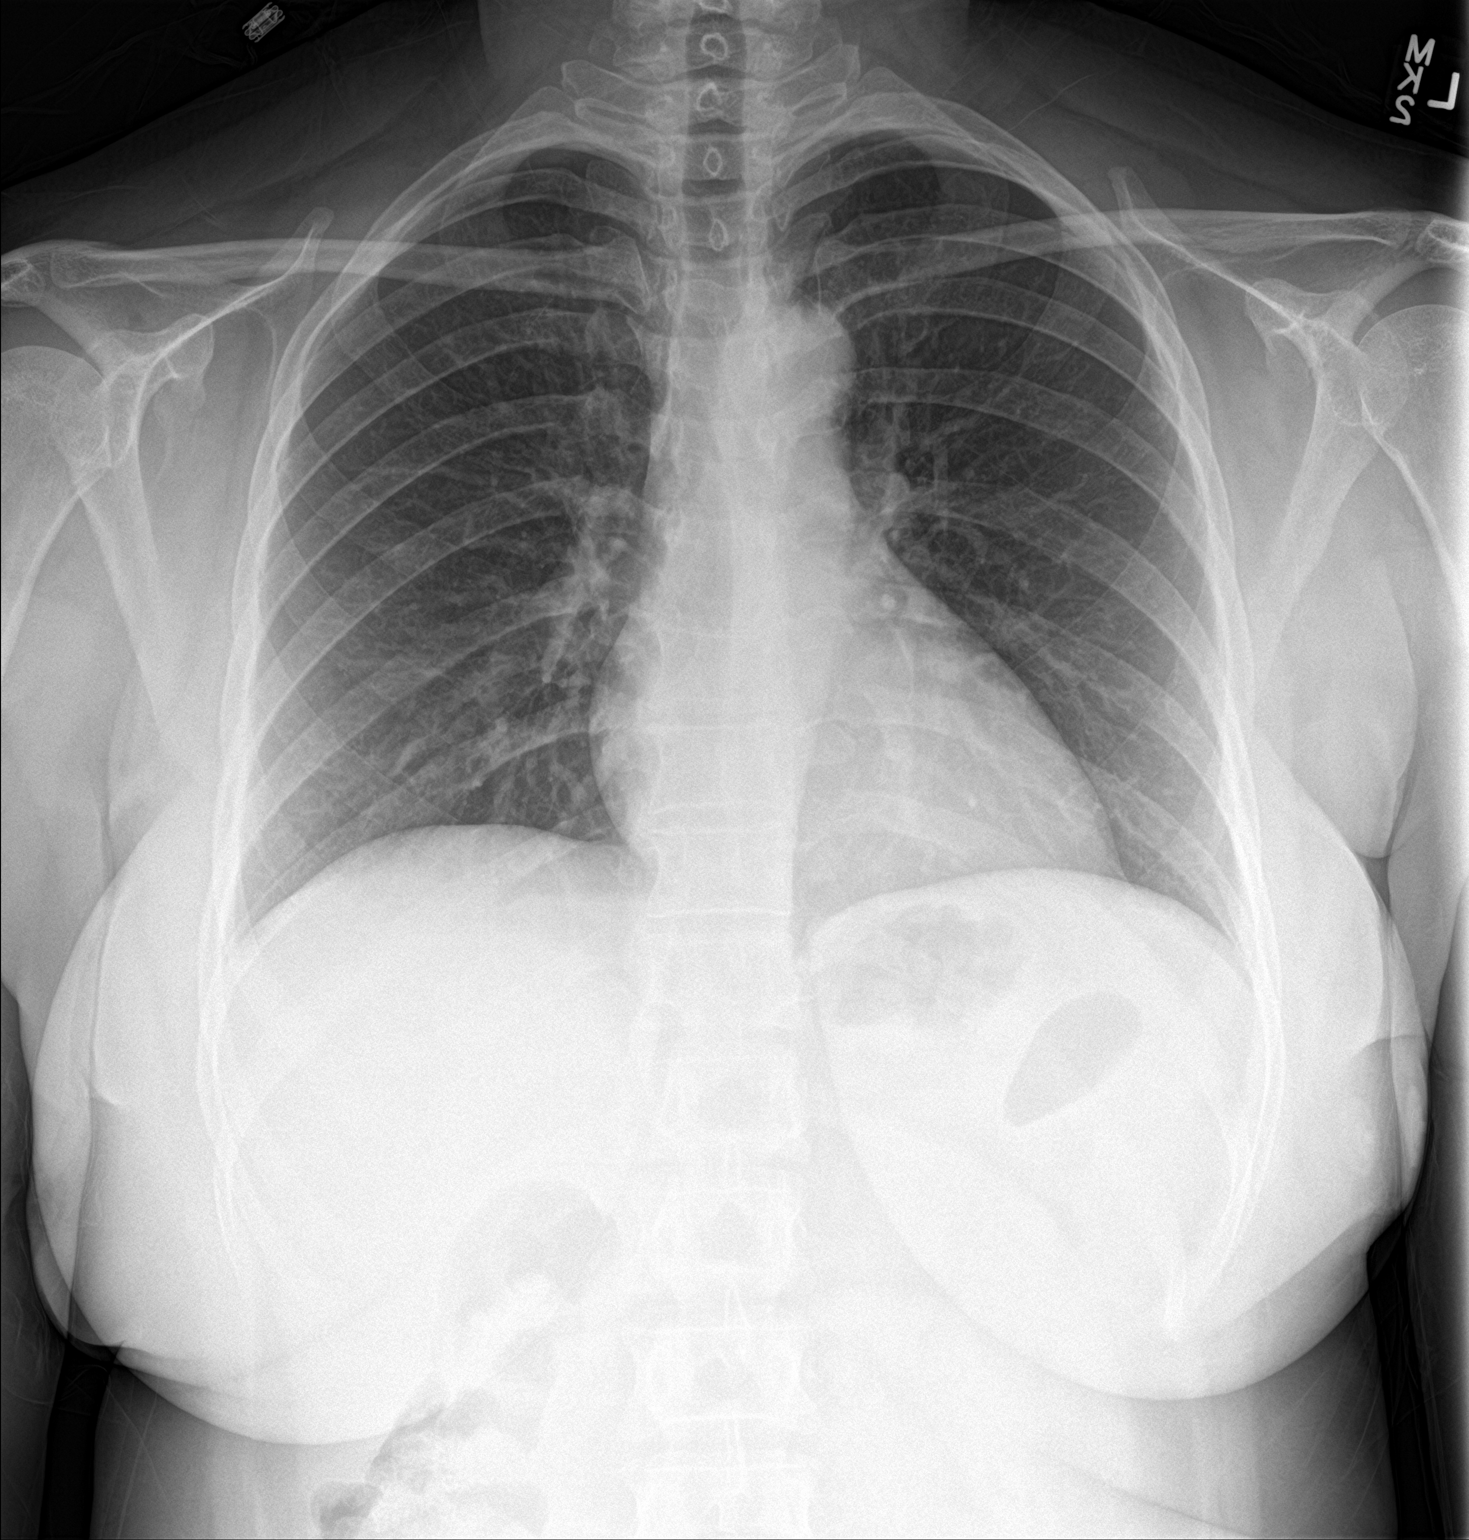

[chest lat]
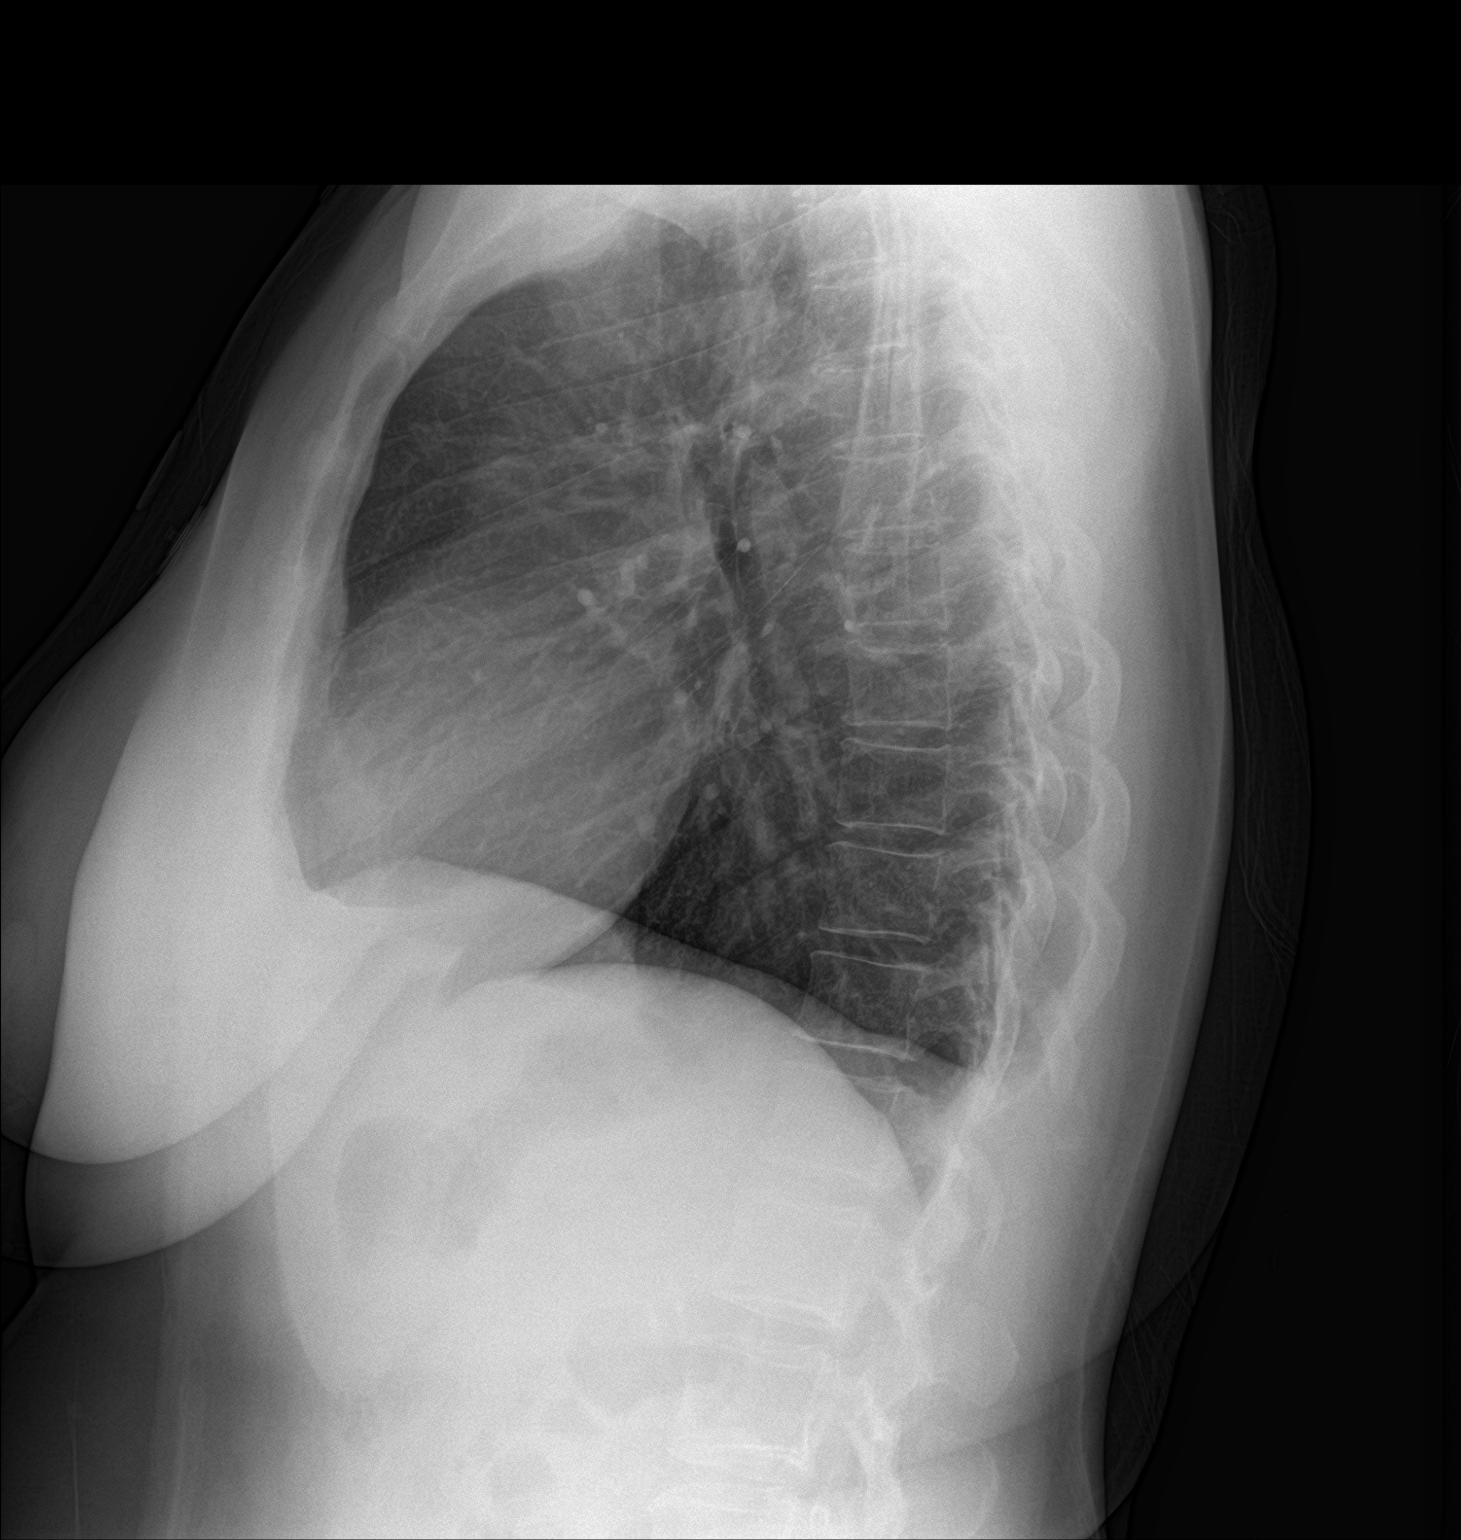

[2 of 2 positions shown; findings below may reference images not displayed]

FINDINGS: The heart size and mediastinal contours are within normal limits.
Both lungs are clear. The visualized skeletal structures are
unremarkable.
IMPRESSION: No active cardiopulmonary disease.

## 2022-02-08 ENCOUNTER — Encounter: Payer: BLUE CROSS/BLUE SHIELD | Admitting: Radiology

## 2022-12-31 ENCOUNTER — Other Ambulatory Visit: Payer: Self-pay

## 2022-12-31 ENCOUNTER — Encounter (HOSPITAL_BASED_OUTPATIENT_CLINIC_OR_DEPARTMENT_OTHER): Payer: Self-pay

## 2022-12-31 DIAGNOSIS — N3000 Acute cystitis without hematuria: Secondary | ICD-10-CM | POA: Insufficient documentation

## 2022-12-31 DIAGNOSIS — Z20822 Contact with and (suspected) exposure to covid-19: Secondary | ICD-10-CM | POA: Insufficient documentation

## 2022-12-31 LAB — URINALYSIS, ROUTINE W REFLEX MICROSCOPIC
Bacteria, UA: NONE SEEN
Bilirubin Urine: NEGATIVE
Glucose, UA: NEGATIVE mg/dL
Ketones, ur: NEGATIVE mg/dL
Nitrite: NEGATIVE
Protein, ur: NEGATIVE mg/dL
Specific Gravity, Urine: 1.01 (ref 1.005–1.030)
pH: 7 (ref 5.0–8.0)

## 2022-12-31 LAB — RESP PANEL BY RT-PCR (RSV, FLU A&B, COVID)  RVPGX2
Influenza A by PCR: NEGATIVE
Influenza B by PCR: NEGATIVE
Resp Syncytial Virus by PCR: NEGATIVE
SARS Coronavirus 2 by RT PCR: NEGATIVE

## 2022-12-31 LAB — CBC
HCT: 32.6 % — ABNORMAL LOW (ref 36.0–46.0)
Hemoglobin: 11.3 g/dL — ABNORMAL LOW (ref 12.0–15.0)
MCH: 28 pg (ref 26.0–34.0)
MCHC: 34.7 g/dL (ref 30.0–36.0)
MCV: 80.7 fL (ref 80.0–100.0)
Platelets: 286 10*3/uL (ref 150–400)
RBC: 4.04 MIL/uL (ref 3.87–5.11)
RDW: 13.4 % (ref 11.5–15.5)
WBC: 7.2 10*3/uL (ref 4.0–10.5)
nRBC: 0 % (ref 0.0–0.2)

## 2022-12-31 LAB — PREGNANCY, URINE: Preg Test, Ur: NEGATIVE

## 2022-12-31 NOTE — ED Triage Notes (Signed)
Patient here POV from Home.  Endorses Lower ABD Pain, Chills, Headache that began a few days ago. Some N/V. No Diarrhea.   NAD Noted during Triage. A&Ox4. GCS 15. BIB Wheelchair.

## 2023-01-01 ENCOUNTER — Emergency Department (HOSPITAL_BASED_OUTPATIENT_CLINIC_OR_DEPARTMENT_OTHER): Payer: Self-pay

## 2023-01-01 ENCOUNTER — Emergency Department (HOSPITAL_BASED_OUTPATIENT_CLINIC_OR_DEPARTMENT_OTHER)
Admission: EM | Admit: 2023-01-01 | Discharge: 2023-01-01 | Disposition: A | Discharge status: Home / Self Care | Payer: Self-pay | Attending: Emergency Medicine | Admitting: Emergency Medicine

## 2023-01-01 DIAGNOSIS — N3000 Acute cystitis without hematuria: Secondary | ICD-10-CM

## 2023-01-01 LAB — COMPREHENSIVE METABOLIC PANEL
ALT: 10 U/L (ref 0–44)
AST: 12 U/L — ABNORMAL LOW (ref 15–41)
Albumin: 3.9 g/dL (ref 3.5–5.0)
Alkaline Phosphatase: 32 U/L — ABNORMAL LOW (ref 38–126)
Anion gap: 7 (ref 5–15)
BUN: 13 mg/dL (ref 6–20)
CO2: 24 mmol/L (ref 22–32)
Calcium: 9.2 mg/dL (ref 8.9–10.3)
Chloride: 106 mmol/L (ref 98–111)
Creatinine, Ser: 0.77 mg/dL (ref 0.44–1.00)
GFR, Estimated: >60 mL/min (ref >60)
Glucose, Bld: 115 mg/dL — ABNORMAL HIGH (ref 70–99)
Potassium: 3.8 mmol/L (ref 3.5–5.1)
Sodium: 137 mmol/L (ref 135–145)
Total Bilirubin: 0.4 mg/dL (ref 0.3–1.2)
Total Protein: 6.8 g/dL (ref 6.5–8.1)

## 2023-01-01 LAB — LIPASE, BLOOD: Lipase: <10 U/L — ABNORMAL LOW (ref 11–51)

## 2023-01-01 MED ORDER — ONDANSETRON HCL 4 MG/2ML IJ SOLN
4.0000 mg | Freq: Once | INTRAMUSCULAR | Status: AC | PRN
  Administered 2023-01-01: 4 mg via INTRAVENOUS
  Filled 2023-01-01: qty 2

## 2023-01-01 MED ORDER — SODIUM CHLORIDE 0.9 % IV SOLN
1.0000 g | Freq: Once | INTRAVENOUS | Status: AC
  Administered 2023-01-01: 1 g via INTRAVENOUS
  Filled 2023-01-01: qty 10

## 2023-01-01 MED ORDER — CEPHALEXIN 500 MG PO CAPS: 500.0000 mg | ORAL_CAPSULE | Freq: Four times a day (QID) | ORAL | 0 refills | Status: AC

## 2023-01-01 MED ORDER — KETOROLAC TROMETHAMINE 30 MG/ML IJ SOLN
15.0000 mg | Freq: Once | INTRAMUSCULAR | Status: AC
  Administered 2023-01-01: 15 mg via INTRAVENOUS
  Filled 2023-01-01: qty 1

## 2023-01-01 NOTE — ED Provider Notes (Signed)
[place] EMERGENCY DEPARTMENT AT [facility] Provider Note   CSN: [id number] Arrival date & time: 12/31/22  2221     History  Chief Complaint  Patient presents with   Abdominal Pain    Kirsten Hoover is a 47 y.o. female.  The history is provided by the patient.  Abdominal Pain Pain location:  Suprapubic Pain quality: aching   Pain radiates to:  Does not radiate Pain severity:  Severe Onset quality:  Gradual Duration: several days. Timing:  Constant Progression:  Unchanged Chronicity:  New Context: not alcohol use   Relieved by:  Nothing Worsened by:  Nothing Ineffective treatments:  None tried Associated symptoms: no anorexia, no diarrhea, no fever and no vomiting   Risk factors: no alcohol abuse        Home Medications Prior to Admission medications   Medication Sig Start Date End Date Taking? Authorizing Provider  acetaminophen (TYLENOL) 500 MG tablet Take 1 tablet (500 mg total) by mouth every 6 (six) hours as needed. Patient taking differently: Take 500 mg by mouth every 6 (six) hours as needed for mild pain.  06/03/19   Law, Waylan Boga, PA-C  Ascorbic Acid (VITAMIN C PO) Take 1 tablet by mouth daily.    [provider]  azithromycin (ZITHROMAX Z-PAK) 250 MG tablet 2 po day one, then 1 daily x 4 days 06/26/19   Fayrene Helper, PA-C  benzonatate (TESSALON) 100 MG capsule Take 1 capsule (100 mg total) by mouth every 8 (eight) hours. 06/26/19   Henderly, Britni A, PA-C  neomycin-polymyxin-dexamethasone (MAXITROL) 0.1 % ophthalmic suspension 4 drops See admin instructions. Place 4 drops into both EARS 3 times for 10 days    [provider]  Omega-3 Fatty Acids (FISH OIL) 1000 MG CAPS Take 1 capsule by mouth 2 (two) times daily.    [provider]  pantoprazole (PROTONIX) 40 MG tablet Take 1 tablet (40 mg total) by mouth daily. 06/03/19   Emi Holes, PA-C  paragard intrauterine copper IUD IUD 1 Intra Uterine Device by  Intrauterine route once.    [provider]  phenazopyridine (PYRIDIUM) 200 MG tablet Take 1 tablet (200 mg total) by mouth 3 (three) times daily as needed. Patient not taking: Reported on 06/26/2019 10/18/15   Peyton Najjar, MD  sucralfate (CARAFATE) 1 g tablet Take 1 tablet (1 g total) by mouth 4 (four) times daily as needed. Patient not taking: Reported on 06/26/2019 05/25/19   Sabas Sous, MD      Allergies    Patient has no known allergies.    Review of Systems   Review of Systems  Constitutional:  Negative for fever.  HENT:  Negative for congestion.   Eyes:  Negative for redness.  Gastrointestinal:  Positive for abdominal pain. Negative for anorexia, diarrhea and vomiting.  All other systems reviewed and are negative.   Physical Exam Updated Vital Signs BP 127/73   Pulse 76   Temp 98.9 F (37.2 C)   Resp 20   SpO2 98%  Physical Exam Vitals and nursing note reviewed.  Constitutional:      General: She is not in acute distress.    Appearance: Normal appearance. She is well-developed.  HENT:     Head: Normocephalic and atraumatic.     Nose: Nose normal.  Eyes:     Pupils: Pupils are equal, round, and reactive to light.  Cardiovascular:     Rate and Rhythm: Normal rate and regular rhythm.  Pulses: Normal pulses.     Heart sounds: Normal heart sounds.  Pulmonary:     Effort: Pulmonary effort is normal. No respiratory distress.     Breath sounds: Normal breath sounds.  Abdominal:     General: Bowel sounds are normal. There is no distension.     Palpations: Abdomen is soft.     Tenderness: There is no abdominal tenderness. There is no guarding or rebound.  Genitourinary:    Vagina: No vaginal discharge.  Musculoskeletal:        General: Normal range of motion.     Cervical back: Normal range of motion and neck supple.  Skin:    General: Skin is warm and dry.     Capillary Refill: Capillary refill takes less than 2 seconds.     Findings: No erythema or  rash.  Neurological:     General: No focal deficit present.     Mental Status: She is alert and oriented to person, place, and time.     Deep Tendon Reflexes: Reflexes normal.  Psychiatric:        Mood and Affect: Mood normal.     ED Results / Procedures / Treatments   Labs (all labs ordered are listed, but only abnormal results are displayed) Results for orders placed or performed during the hospital encounter of 01/01/23  Resp panel by RT-PCR (RSV, Flu A&B, Covid) Anterior Nasal Swab   Specimen: Anterior Nasal Swab  Result Value Ref Range   SARS Coronavirus 2 by RT PCR NEGATIVE NEGATIVE   Influenza A by PCR NEGATIVE NEGATIVE   Influenza B by PCR NEGATIVE NEGATIVE   Resp Syncytial Virus by PCR NEGATIVE NEGATIVE  CBC  Result Value Ref Range   WBC 7.2 4.0 - 10.5 K/uL   RBC 4.04 3.87 - 5.11 MIL/uL   Hemoglobin 11.3 (L) 12.0 - 15.0 g/dL   HCT 11.9 (L) 14.7 - 82.9 %   MCV 80.7 80.0 - 100.0 fL   MCH 28.0 26.0 - 34.0 pg   MCHC 34.7 30.0 - 36.0 g/dL   RDW 56.2 13.0 - 86.5 %   Platelets 286 150 - 400 K/uL   nRBC 0.0 0.0 - 0.2 %  Urinalysis, Routine w reflex microscopic -Urine, Clean Catch  Result Value Ref Range   Color, Urine YELLOW YELLOW   APPearance CLEAR CLEAR   Specific Gravity, Urine 1.010 1.005 - 1.030   pH 7.0 5.0 - 8.0   Glucose, UA NEGATIVE NEGATIVE mg/dL   Hgb urine dipstick TRACE (A) NEGATIVE   Bilirubin Urine NEGATIVE NEGATIVE   Ketones, ur NEGATIVE NEGATIVE mg/dL   Protein, ur NEGATIVE NEGATIVE mg/dL   Nitrite NEGATIVE NEGATIVE   Leukocytes,Ua MODERATE (A) NEGATIVE   RBC / HPF 6-10 0 - 5 RBC/hpf   WBC, UA 6-10 0 - 5 WBC/hpf   Bacteria, UA NONE SEEN NONE SEEN   Squamous Epithelial / HPF 0-5 0 - 5 /HPF  Pregnancy, urine  Result Value Ref Range   Preg Test, Ur NEGATIVE NEGATIVE  Comprehensive metabolic panel  Result Value Ref Range   Sodium 137 135 - 145 mmol/L   Potassium 3.8 3.5 - 5.1 mmol/L   Chloride 106 98 - 111 mmol/L   CO2 24 22 - 32 mmol/L    Glucose, Bld 115 (H) 70 - 99 mg/dL   BUN 13 6 - 20 mg/dL   Creatinine, Ser 7.84 0.44 - 1.00 mg/dL   Calcium 9.2 8.9 - 69.6 mg/dL   Total Protein 6.8 6.5 -  8.1 g/dL   Albumin 3.9 3.5 - 5.0 g/dL   AST 12 (L) 15 - 41 U/L   ALT 10 0 - 44 U/L   Alkaline Phosphatase 32 (L) 38 - 126 U/L   Total Bilirubin 0.4 0.3 - 1.2 mg/dL   GFR, Estimated >40 >98 mL/min   Anion gap 7 5 - 15  Lipase, blood  Result Value Ref Range   Lipase <10 (L) 11 - 51 U/L   CT Renal Stone Study  Result Date: 01/01/2023 CLINICAL DATA:  Abdominal/flank pain.  Stones suspected EXAM: CT ABDOMEN AND PELVIS WITHOUT CONTRAST TECHNIQUE: Multidetector CT imaging of the abdomen and pelvis was performed following the standard protocol without IV contrast. RADIATION DOSE REDUCTION: This exam was performed according to the departmental dose-optimization program which includes automated exposure control, adjustment of the mA and/or kV according to patient size and/or use of iterative reconstruction technique. COMPARISON:  08/19/2016 FINDINGS: Lower chest: No acute abnormality. Hepatobiliary: Unremarkable liver. Normal gallbladder. No biliary dilation. Pancreas: Unremarkable. Spleen: Unremarkable. Adrenals/Urinary Tract: Normal adrenal glands. Absent right kidney. No urinary calculi or hydronephrosis. Bladder is unremarkable. Stomach/Bowel: Normal caliber large and small bowel. No bowel wall thickening. The appendix is not visualized.Stomach is within normal limits. Vascular/Lymphatic: No significant vascular findings are present. No enlarged abdominal or pelvic lymph nodes. Reproductive: IUD in the uterus.  No adnexal mass. Other: No free intraperitoneal fluid or air. Musculoskeletal: No acute fracture. IMPRESSION: 1. No acute abnormality in the abdomen or pelvis. Electronically Signed   By: Minerva Fester M.D.   On: 01/01/2023 01:30    Radiology CT Renal Stone Study  Result Date: 01/01/2023 CLINICAL DATA:  Abdominal/flank pain.  Stones  suspected EXAM: CT ABDOMEN AND PELVIS WITHOUT CONTRAST TECHNIQUE: Multidetector CT imaging of the abdomen and pelvis was performed following the standard protocol without IV contrast. RADIATION DOSE REDUCTION: This exam was performed according to the departmental dose-optimization program which includes automated exposure control, adjustment of the mA and/or kV according to patient size and/or use of iterative reconstruction technique. COMPARISON:  08/19/2016 FINDINGS: Lower chest: No acute abnormality. Hepatobiliary: Unremarkable liver. Normal gallbladder. No biliary dilation. Pancreas: Unremarkable. Spleen: Unremarkable. Adrenals/Urinary Tract: Normal adrenal glands. Absent right kidney. No urinary calculi or hydronephrosis. Bladder is unremarkable. Stomach/Bowel: Normal caliber large and small bowel. No bowel wall thickening. The appendix is not visualized.Stomach is within normal limits. Vascular/Lymphatic: No significant vascular findings are present. No enlarged abdominal or pelvic lymph nodes. Reproductive: IUD in the uterus.  No adnexal mass. Other: No free intraperitoneal fluid or air. Musculoskeletal: No acute fracture. IMPRESSION: 1. No acute abnormality in the abdomen or pelvis. Electronically Signed   By: Minerva Fester M.D.   On: 01/01/2023 01:30    Procedures Procedures    Medications Ordered in ED Medications  ondansetron (ZOFRAN) injection 4 mg (4 mg Intravenous Given 01/01/23 0051)  ketorolac (TORADOL) 30 MG/ML injection 15 mg (15 mg Intravenous Given 01/01/23 0225)  cefTRIAXone (ROCEPHIN) 1 g in sodium chloride 0.9 % 100 mL IVPB (1 g Intravenous New Bag/Given 01/01/23 0233)    ED Course/ Medical Decision Making/ A&P                             Medical Decision Making Patient with lower abdominal pain   Amount and/or Complexity of Data Reviewed Independent Historian: friend    Details: See above  External Data Reviewed: notes.    Details: Previous notes reviewed  Labs:  ordered.    Details: Urine is consistent with UTI.  Negative lipase, normal sodium 137, normal potassium 3.8, normal creatinine.  Negative covid and flu.  Normal white count 7.2, low hemoglobin 11.3, normal platelets  Radiology: ordered.    Details: Negative CT by me   Risk Prescription drug management. Risk Details: Patient with normal exam and labs and imaging.  Patient has a UTI.  Will start antibiotics and have patient follow up with PMD for ongoing care.  Stable for discharge.  Strict return     Final Clinical Impression(s) / ED Diagnoses Final diagnoses:  None   Return for intractable cough, coughing up blood, fevers > 100.4 unrelieved by medication, shortness of breath, intractable vomiting, chest pain, shortness of breath, weakness, numbness, changes in speech, facial asymmetry, abdominal pain, passing out, Inability to tolerate liquids or food, cough, altered mental status or any concerns. No signs of systemic illness or infection. The patient is nontoxic-appearing on exam and vital signs are within normal limits.  I have reviewed the triage vital signs and the nursing notes. Pertinent labs & imaging results that were available during my care of the patient were reviewed by me and considered in my medical decision making (see chart for details). After history, exam, and medical workup I feel the patient has been appropriately medically screened and is safe for discharge home. Pertinent diagnoses were discussed with the patient. Patient was given return precautions.    Rx / DC Orders ED Discharge Orders     None         Shadell Brenn, MD 01/01/23 [phone]
